# Patient Record
Sex: Female | Born: 1935
Health system: Southern US, Community
[De-identification: ages and names within clinical notes are randomized; demographics above are authoritative.]

## PROBLEM LIST (undated history)

## (undated) DIAGNOSIS — R569 Unspecified convulsions: Secondary | ICD-10-CM

---

## 2014-10-30 DIAGNOSIS — Z96651 Presence of right artificial knee joint: Secondary | ICD-10-CM | POA: Diagnosis not present

## 2014-10-30 DIAGNOSIS — Z7982 Long term (current) use of aspirin: Secondary | ICD-10-CM | POA: Diagnosis not present

## 2014-10-30 DIAGNOSIS — R2689 Other abnormalities of gait and mobility: Secondary | ICD-10-CM | POA: Diagnosis not present

## 2014-10-30 DIAGNOSIS — I10 Essential (primary) hypertension: Secondary | ICD-10-CM | POA: Diagnosis not present

## 2014-10-30 DIAGNOSIS — Z471 Aftercare following joint replacement surgery: Secondary | ICD-10-CM | POA: Diagnosis not present

## 2014-10-30 DIAGNOSIS — G40909 Epilepsy, unspecified, not intractable, without status epilepticus: Secondary | ICD-10-CM | POA: Diagnosis not present

## 2014-11-02 DIAGNOSIS — I10 Essential (primary) hypertension: Secondary | ICD-10-CM | POA: Diagnosis not present

## 2014-11-02 DIAGNOSIS — R2689 Other abnormalities of gait and mobility: Secondary | ICD-10-CM | POA: Diagnosis not present

## 2014-11-02 DIAGNOSIS — Z96651 Presence of right artificial knee joint: Secondary | ICD-10-CM | POA: Diagnosis not present

## 2014-11-02 DIAGNOSIS — Z7982 Long term (current) use of aspirin: Secondary | ICD-10-CM | POA: Diagnosis not present

## 2014-11-02 DIAGNOSIS — Z471 Aftercare following joint replacement surgery: Secondary | ICD-10-CM | POA: Diagnosis not present

## 2014-11-02 DIAGNOSIS — G40909 Epilepsy, unspecified, not intractable, without status epilepticus: Secondary | ICD-10-CM | POA: Diagnosis not present

## 2014-11-03 DIAGNOSIS — Z96651 Presence of right artificial knee joint: Secondary | ICD-10-CM | POA: Diagnosis not present

## 2014-11-03 DIAGNOSIS — R2689 Other abnormalities of gait and mobility: Secondary | ICD-10-CM | POA: Diagnosis not present

## 2014-11-03 DIAGNOSIS — Z7982 Long term (current) use of aspirin: Secondary | ICD-10-CM | POA: Diagnosis not present

## 2014-11-03 DIAGNOSIS — Z471 Aftercare following joint replacement surgery: Secondary | ICD-10-CM | POA: Diagnosis not present

## 2014-11-03 DIAGNOSIS — I10 Essential (primary) hypertension: Secondary | ICD-10-CM | POA: Diagnosis not present

## 2014-11-03 DIAGNOSIS — G40909 Epilepsy, unspecified, not intractable, without status epilepticus: Secondary | ICD-10-CM | POA: Diagnosis not present

## 2014-11-05 DIAGNOSIS — G40909 Epilepsy, unspecified, not intractable, without status epilepticus: Secondary | ICD-10-CM | POA: Diagnosis not present

## 2014-11-05 DIAGNOSIS — Z7982 Long term (current) use of aspirin: Secondary | ICD-10-CM | POA: Diagnosis not present

## 2014-11-05 DIAGNOSIS — R2689 Other abnormalities of gait and mobility: Secondary | ICD-10-CM | POA: Diagnosis not present

## 2014-11-05 DIAGNOSIS — Z96651 Presence of right artificial knee joint: Secondary | ICD-10-CM | POA: Diagnosis not present

## 2014-11-05 DIAGNOSIS — Z471 Aftercare following joint replacement surgery: Secondary | ICD-10-CM | POA: Diagnosis not present

## 2014-11-05 DIAGNOSIS — I10 Essential (primary) hypertension: Secondary | ICD-10-CM | POA: Diagnosis not present

## 2014-11-07 DIAGNOSIS — Z471 Aftercare following joint replacement surgery: Secondary | ICD-10-CM | POA: Diagnosis not present

## 2014-11-07 DIAGNOSIS — R2689 Other abnormalities of gait and mobility: Secondary | ICD-10-CM | POA: Diagnosis not present

## 2014-11-07 DIAGNOSIS — I10 Essential (primary) hypertension: Secondary | ICD-10-CM | POA: Diagnosis not present

## 2014-11-07 DIAGNOSIS — Z7982 Long term (current) use of aspirin: Secondary | ICD-10-CM | POA: Diagnosis not present

## 2014-11-07 DIAGNOSIS — G40909 Epilepsy, unspecified, not intractable, without status epilepticus: Secondary | ICD-10-CM | POA: Diagnosis not present

## 2014-11-07 DIAGNOSIS — Z96651 Presence of right artificial knee joint: Secondary | ICD-10-CM | POA: Diagnosis not present

## 2014-11-08 DIAGNOSIS — Z7982 Long term (current) use of aspirin: Secondary | ICD-10-CM | POA: Diagnosis not present

## 2014-11-08 DIAGNOSIS — Z471 Aftercare following joint replacement surgery: Secondary | ICD-10-CM | POA: Diagnosis not present

## 2014-11-08 DIAGNOSIS — Z96651 Presence of right artificial knee joint: Secondary | ICD-10-CM | POA: Diagnosis not present

## 2014-11-08 DIAGNOSIS — R2689 Other abnormalities of gait and mobility: Secondary | ICD-10-CM | POA: Diagnosis not present

## 2014-11-08 DIAGNOSIS — G40909 Epilepsy, unspecified, not intractable, without status epilepticus: Secondary | ICD-10-CM | POA: Diagnosis not present

## 2014-11-08 DIAGNOSIS — I10 Essential (primary) hypertension: Secondary | ICD-10-CM | POA: Diagnosis not present

## 2014-11-09 DIAGNOSIS — Z96651 Presence of right artificial knee joint: Secondary | ICD-10-CM | POA: Diagnosis not present

## 2014-11-09 DIAGNOSIS — Z471 Aftercare following joint replacement surgery: Secondary | ICD-10-CM | POA: Diagnosis not present

## 2014-11-09 DIAGNOSIS — G40909 Epilepsy, unspecified, not intractable, without status epilepticus: Secondary | ICD-10-CM | POA: Diagnosis not present

## 2014-11-09 DIAGNOSIS — Z7982 Long term (current) use of aspirin: Secondary | ICD-10-CM | POA: Diagnosis not present

## 2014-11-09 DIAGNOSIS — I10 Essential (primary) hypertension: Secondary | ICD-10-CM | POA: Diagnosis not present

## 2014-11-09 DIAGNOSIS — R2689 Other abnormalities of gait and mobility: Secondary | ICD-10-CM | POA: Diagnosis not present

## 2014-11-13 DIAGNOSIS — Z96651 Presence of right artificial knee joint: Secondary | ICD-10-CM | POA: Diagnosis not present

## 2014-11-13 DIAGNOSIS — Z7982 Long term (current) use of aspirin: Secondary | ICD-10-CM | POA: Diagnosis not present

## 2014-11-13 DIAGNOSIS — G40909 Epilepsy, unspecified, not intractable, without status epilepticus: Secondary | ICD-10-CM | POA: Diagnosis not present

## 2014-11-13 DIAGNOSIS — Z471 Aftercare following joint replacement surgery: Secondary | ICD-10-CM | POA: Diagnosis not present

## 2014-11-13 DIAGNOSIS — R2689 Other abnormalities of gait and mobility: Secondary | ICD-10-CM | POA: Diagnosis not present

## 2014-11-13 DIAGNOSIS — I10 Essential (primary) hypertension: Secondary | ICD-10-CM | POA: Diagnosis not present

## 2014-11-16 DIAGNOSIS — Z96651 Presence of right artificial knee joint: Secondary | ICD-10-CM | POA: Diagnosis not present

## 2014-11-16 DIAGNOSIS — Z471 Aftercare following joint replacement surgery: Secondary | ICD-10-CM | POA: Diagnosis not present

## 2014-11-16 DIAGNOSIS — I10 Essential (primary) hypertension: Secondary | ICD-10-CM | POA: Diagnosis not present

## 2014-11-16 DIAGNOSIS — G40909 Epilepsy, unspecified, not intractable, without status epilepticus: Secondary | ICD-10-CM | POA: Diagnosis not present

## 2014-11-16 DIAGNOSIS — Z7982 Long term (current) use of aspirin: Secondary | ICD-10-CM | POA: Diagnosis not present

## 2014-11-16 DIAGNOSIS — R2689 Other abnormalities of gait and mobility: Secondary | ICD-10-CM | POA: Diagnosis not present

## 2014-11-21 DIAGNOSIS — Z7982 Long term (current) use of aspirin: Secondary | ICD-10-CM | POA: Diagnosis not present

## 2014-11-21 DIAGNOSIS — Z471 Aftercare following joint replacement surgery: Secondary | ICD-10-CM | POA: Diagnosis not present

## 2014-11-21 DIAGNOSIS — G40909 Epilepsy, unspecified, not intractable, without status epilepticus: Secondary | ICD-10-CM | POA: Diagnosis not present

## 2014-11-21 DIAGNOSIS — R2689 Other abnormalities of gait and mobility: Secondary | ICD-10-CM | POA: Diagnosis not present

## 2014-11-21 DIAGNOSIS — I10 Essential (primary) hypertension: Secondary | ICD-10-CM | POA: Diagnosis not present

## 2014-11-21 DIAGNOSIS — Z96651 Presence of right artificial knee joint: Secondary | ICD-10-CM | POA: Diagnosis not present

## 2014-11-23 DIAGNOSIS — Z7982 Long term (current) use of aspirin: Secondary | ICD-10-CM | POA: Diagnosis not present

## 2014-11-23 DIAGNOSIS — G40909 Epilepsy, unspecified, not intractable, without status epilepticus: Secondary | ICD-10-CM | POA: Diagnosis not present

## 2014-11-23 DIAGNOSIS — R2689 Other abnormalities of gait and mobility: Secondary | ICD-10-CM | POA: Diagnosis not present

## 2014-11-23 DIAGNOSIS — Z96651 Presence of right artificial knee joint: Secondary | ICD-10-CM | POA: Diagnosis not present

## 2014-11-23 DIAGNOSIS — I10 Essential (primary) hypertension: Secondary | ICD-10-CM | POA: Diagnosis not present

## 2014-11-23 DIAGNOSIS — Z471 Aftercare following joint replacement surgery: Secondary | ICD-10-CM | POA: Diagnosis not present

## 2014-11-26 DIAGNOSIS — Z7982 Long term (current) use of aspirin: Secondary | ICD-10-CM | POA: Diagnosis not present

## 2014-11-26 DIAGNOSIS — G40909 Epilepsy, unspecified, not intractable, without status epilepticus: Secondary | ICD-10-CM | POA: Diagnosis not present

## 2014-11-26 DIAGNOSIS — Z96651 Presence of right artificial knee joint: Secondary | ICD-10-CM | POA: Diagnosis not present

## 2014-11-26 DIAGNOSIS — R2689 Other abnormalities of gait and mobility: Secondary | ICD-10-CM | POA: Diagnosis not present

## 2014-11-26 DIAGNOSIS — I10 Essential (primary) hypertension: Secondary | ICD-10-CM | POA: Diagnosis not present

## 2014-11-26 DIAGNOSIS — Z471 Aftercare following joint replacement surgery: Secondary | ICD-10-CM | POA: Diagnosis not present

## 2014-11-29 DIAGNOSIS — Z7982 Long term (current) use of aspirin: Secondary | ICD-10-CM | POA: Diagnosis not present

## 2014-11-29 DIAGNOSIS — G40909 Epilepsy, unspecified, not intractable, without status epilepticus: Secondary | ICD-10-CM | POA: Diagnosis not present

## 2014-11-29 DIAGNOSIS — Z96651 Presence of right artificial knee joint: Secondary | ICD-10-CM | POA: Diagnosis not present

## 2014-11-29 DIAGNOSIS — R2689 Other abnormalities of gait and mobility: Secondary | ICD-10-CM | POA: Diagnosis not present

## 2014-11-29 DIAGNOSIS — I10 Essential (primary) hypertension: Secondary | ICD-10-CM | POA: Diagnosis not present

## 2014-11-29 DIAGNOSIS — Z471 Aftercare following joint replacement surgery: Secondary | ICD-10-CM | POA: Diagnosis not present

## 2014-12-04 DIAGNOSIS — G40909 Epilepsy, unspecified, not intractable, without status epilepticus: Secondary | ICD-10-CM | POA: Diagnosis not present

## 2014-12-04 DIAGNOSIS — R2689 Other abnormalities of gait and mobility: Secondary | ICD-10-CM | POA: Diagnosis not present

## 2014-12-04 DIAGNOSIS — Z96651 Presence of right artificial knee joint: Secondary | ICD-10-CM | POA: Diagnosis not present

## 2014-12-04 DIAGNOSIS — Z7982 Long term (current) use of aspirin: Secondary | ICD-10-CM | POA: Diagnosis not present

## 2014-12-04 DIAGNOSIS — Z471 Aftercare following joint replacement surgery: Secondary | ICD-10-CM | POA: Diagnosis not present

## 2014-12-04 DIAGNOSIS — I10 Essential (primary) hypertension: Secondary | ICD-10-CM | POA: Diagnosis not present

## 2014-12-06 DIAGNOSIS — G40909 Epilepsy, unspecified, not intractable, without status epilepticus: Secondary | ICD-10-CM | POA: Diagnosis not present

## 2014-12-06 DIAGNOSIS — Z471 Aftercare following joint replacement surgery: Secondary | ICD-10-CM | POA: Diagnosis not present

## 2014-12-06 DIAGNOSIS — Z7982 Long term (current) use of aspirin: Secondary | ICD-10-CM | POA: Diagnosis not present

## 2014-12-06 DIAGNOSIS — R2689 Other abnormalities of gait and mobility: Secondary | ICD-10-CM | POA: Diagnosis not present

## 2014-12-06 DIAGNOSIS — Z96651 Presence of right artificial knee joint: Secondary | ICD-10-CM | POA: Diagnosis not present

## 2014-12-06 DIAGNOSIS — I10 Essential (primary) hypertension: Secondary | ICD-10-CM | POA: Diagnosis not present

## 2014-12-12 DIAGNOSIS — Z96651 Presence of right artificial knee joint: Secondary | ICD-10-CM | POA: Diagnosis not present

## 2014-12-12 DIAGNOSIS — G40909 Epilepsy, unspecified, not intractable, without status epilepticus: Secondary | ICD-10-CM | POA: Diagnosis not present

## 2014-12-12 DIAGNOSIS — Z7982 Long term (current) use of aspirin: Secondary | ICD-10-CM | POA: Diagnosis not present

## 2014-12-12 DIAGNOSIS — R2689 Other abnormalities of gait and mobility: Secondary | ICD-10-CM | POA: Diagnosis not present

## 2014-12-12 DIAGNOSIS — Z471 Aftercare following joint replacement surgery: Secondary | ICD-10-CM | POA: Diagnosis not present

## 2014-12-12 DIAGNOSIS — I10 Essential (primary) hypertension: Secondary | ICD-10-CM | POA: Diagnosis not present

## 2014-12-13 DIAGNOSIS — G40909 Epilepsy, unspecified, not intractable, without status epilepticus: Secondary | ICD-10-CM | POA: Diagnosis not present

## 2014-12-13 DIAGNOSIS — I10 Essential (primary) hypertension: Secondary | ICD-10-CM | POA: Diagnosis not present

## 2014-12-13 DIAGNOSIS — R5383 Other fatigue: Secondary | ICD-10-CM | POA: Diagnosis not present

## 2014-12-13 DIAGNOSIS — M179 Osteoarthritis of knee, unspecified: Secondary | ICD-10-CM | POA: Diagnosis not present

## 2014-12-14 DIAGNOSIS — F411 Generalized anxiety disorder: Secondary | ICD-10-CM | POA: Diagnosis not present

## 2014-12-14 DIAGNOSIS — Z7982 Long term (current) use of aspirin: Secondary | ICD-10-CM | POA: Diagnosis not present

## 2014-12-14 DIAGNOSIS — G40909 Epilepsy, unspecified, not intractable, without status epilepticus: Secondary | ICD-10-CM | POA: Diagnosis not present

## 2014-12-14 DIAGNOSIS — Z96651 Presence of right artificial knee joint: Secondary | ICD-10-CM | POA: Diagnosis not present

## 2014-12-14 DIAGNOSIS — Z471 Aftercare following joint replacement surgery: Secondary | ICD-10-CM | POA: Diagnosis not present

## 2014-12-14 DIAGNOSIS — I10 Essential (primary) hypertension: Secondary | ICD-10-CM | POA: Diagnosis not present

## 2014-12-14 DIAGNOSIS — R2689 Other abnormalities of gait and mobility: Secondary | ICD-10-CM | POA: Diagnosis not present

## 2014-12-14 DIAGNOSIS — F330 Major depressive disorder, recurrent, mild: Secondary | ICD-10-CM | POA: Diagnosis not present

## 2014-12-15 DIAGNOSIS — N390 Urinary tract infection, site not specified: Secondary | ICD-10-CM | POA: Diagnosis not present

## 2014-12-18 DIAGNOSIS — G40109 Localization-related (focal) (partial) symptomatic epilepsy and epileptic syndromes with simple partial seizures, not intractable, without status epilepticus: Secondary | ICD-10-CM | POA: Diagnosis not present

## 2014-12-18 DIAGNOSIS — F411 Generalized anxiety disorder: Secondary | ICD-10-CM | POA: Diagnosis not present

## 2014-12-18 DIAGNOSIS — F330 Major depressive disorder, recurrent, mild: Secondary | ICD-10-CM | POA: Diagnosis not present

## 2014-12-19 DIAGNOSIS — Z471 Aftercare following joint replacement surgery: Secondary | ICD-10-CM | POA: Diagnosis not present

## 2014-12-19 DIAGNOSIS — Z96651 Presence of right artificial knee joint: Secondary | ICD-10-CM | POA: Diagnosis not present

## 2014-12-19 DIAGNOSIS — Z7982 Long term (current) use of aspirin: Secondary | ICD-10-CM | POA: Diagnosis not present

## 2014-12-19 DIAGNOSIS — I10 Essential (primary) hypertension: Secondary | ICD-10-CM | POA: Diagnosis not present

## 2014-12-19 DIAGNOSIS — R2689 Other abnormalities of gait and mobility: Secondary | ICD-10-CM | POA: Diagnosis not present

## 2014-12-19 DIAGNOSIS — G40909 Epilepsy, unspecified, not intractable, without status epilepticus: Secondary | ICD-10-CM | POA: Diagnosis not present

## 2014-12-20 DIAGNOSIS — Z96651 Presence of right artificial knee joint: Secondary | ICD-10-CM | POA: Diagnosis not present

## 2014-12-20 DIAGNOSIS — M25561 Pain in right knee: Secondary | ICD-10-CM | POA: Diagnosis not present

## 2014-12-20 DIAGNOSIS — M150 Primary generalized (osteo)arthritis: Secondary | ICD-10-CM | POA: Diagnosis not present

## 2014-12-20 DIAGNOSIS — R2689 Other abnormalities of gait and mobility: Secondary | ICD-10-CM | POA: Diagnosis not present

## 2014-12-21 DIAGNOSIS — R2689 Other abnormalities of gait and mobility: Secondary | ICD-10-CM | POA: Diagnosis not present

## 2014-12-21 DIAGNOSIS — Z471 Aftercare following joint replacement surgery: Secondary | ICD-10-CM | POA: Diagnosis not present

## 2014-12-21 DIAGNOSIS — I10 Essential (primary) hypertension: Secondary | ICD-10-CM | POA: Diagnosis not present

## 2014-12-21 DIAGNOSIS — G40909 Epilepsy, unspecified, not intractable, without status epilepticus: Secondary | ICD-10-CM | POA: Diagnosis not present

## 2014-12-21 DIAGNOSIS — Z96651 Presence of right artificial knee joint: Secondary | ICD-10-CM | POA: Diagnosis not present

## 2014-12-21 DIAGNOSIS — Z7982 Long term (current) use of aspirin: Secondary | ICD-10-CM | POA: Diagnosis not present

## 2014-12-25 DIAGNOSIS — R2689 Other abnormalities of gait and mobility: Secondary | ICD-10-CM | POA: Diagnosis not present

## 2014-12-25 DIAGNOSIS — Z96651 Presence of right artificial knee joint: Secondary | ICD-10-CM | POA: Diagnosis not present

## 2014-12-25 DIAGNOSIS — Z471 Aftercare following joint replacement surgery: Secondary | ICD-10-CM | POA: Diagnosis not present

## 2014-12-25 DIAGNOSIS — I10 Essential (primary) hypertension: Secondary | ICD-10-CM | POA: Diagnosis not present

## 2014-12-25 DIAGNOSIS — G40909 Epilepsy, unspecified, not intractable, without status epilepticus: Secondary | ICD-10-CM | POA: Diagnosis not present

## 2014-12-25 DIAGNOSIS — Z7982 Long term (current) use of aspirin: Secondary | ICD-10-CM | POA: Diagnosis not present

## 2014-12-26 DIAGNOSIS — Z96651 Presence of right artificial knee joint: Secondary | ICD-10-CM | POA: Diagnosis not present

## 2014-12-26 DIAGNOSIS — E874 Mixed disorder of acid-base balance: Secondary | ICD-10-CM | POA: Diagnosis not present

## 2014-12-26 DIAGNOSIS — G40909 Epilepsy, unspecified, not intractable, without status epilepticus: Secondary | ICD-10-CM | POA: Diagnosis not present

## 2014-12-26 DIAGNOSIS — Z471 Aftercare following joint replacement surgery: Secondary | ICD-10-CM | POA: Diagnosis not present

## 2014-12-26 DIAGNOSIS — D649 Anemia, unspecified: Secondary | ICD-10-CM | POA: Diagnosis not present

## 2014-12-26 DIAGNOSIS — Z7982 Long term (current) use of aspirin: Secondary | ICD-10-CM | POA: Diagnosis not present

## 2014-12-26 DIAGNOSIS — R2689 Other abnormalities of gait and mobility: Secondary | ICD-10-CM | POA: Diagnosis not present

## 2014-12-26 DIAGNOSIS — I10 Essential (primary) hypertension: Secondary | ICD-10-CM | POA: Diagnosis not present

## 2014-12-27 DIAGNOSIS — Z471 Aftercare following joint replacement surgery: Secondary | ICD-10-CM | POA: Diagnosis not present

## 2014-12-27 DIAGNOSIS — Z96651 Presence of right artificial knee joint: Secondary | ICD-10-CM | POA: Diagnosis not present

## 2014-12-27 DIAGNOSIS — I10 Essential (primary) hypertension: Secondary | ICD-10-CM | POA: Diagnosis not present

## 2014-12-27 DIAGNOSIS — G40909 Epilepsy, unspecified, not intractable, without status epilepticus: Secondary | ICD-10-CM | POA: Diagnosis not present

## 2014-12-27 DIAGNOSIS — Z7982 Long term (current) use of aspirin: Secondary | ICD-10-CM | POA: Diagnosis not present

## 2014-12-27 DIAGNOSIS — R2689 Other abnormalities of gait and mobility: Secondary | ICD-10-CM | POA: Diagnosis not present

## 2014-12-29 DIAGNOSIS — M25361 Other instability, right knee: Secondary | ICD-10-CM | POA: Diagnosis not present

## 2014-12-29 DIAGNOSIS — Z96651 Presence of right artificial knee joint: Secondary | ICD-10-CM | POA: Diagnosis not present

## 2014-12-29 DIAGNOSIS — I10 Essential (primary) hypertension: Secondary | ICD-10-CM | POA: Diagnosis not present

## 2014-12-29 DIAGNOSIS — R260 Ataxic gait: Secondary | ICD-10-CM | POA: Diagnosis not present

## 2014-12-29 DIAGNOSIS — Z471 Aftercare following joint replacement surgery: Secondary | ICD-10-CM | POA: Diagnosis not present

## 2014-12-29 DIAGNOSIS — Z9181 History of falling: Secondary | ICD-10-CM | POA: Diagnosis not present

## 2014-12-29 DIAGNOSIS — M25561 Pain in right knee: Secondary | ICD-10-CM | POA: Diagnosis not present

## 2014-12-29 DIAGNOSIS — D510 Vitamin B12 deficiency anemia due to intrinsic factor deficiency: Secondary | ICD-10-CM | POA: Diagnosis not present

## 2014-12-29 DIAGNOSIS — Z96652 Presence of left artificial knee joint: Secondary | ICD-10-CM | POA: Diagnosis not present

## 2014-12-29 DIAGNOSIS — M6281 Muscle weakness (generalized): Secondary | ICD-10-CM | POA: Diagnosis not present

## 2014-12-29 DIAGNOSIS — G40909 Epilepsy, unspecified, not intractable, without status epilepticus: Secondary | ICD-10-CM | POA: Diagnosis not present

## 2014-12-29 DIAGNOSIS — Z79899 Other long term (current) drug therapy: Secondary | ICD-10-CM | POA: Diagnosis not present

## 2014-12-31 DIAGNOSIS — I10 Essential (primary) hypertension: Secondary | ICD-10-CM | POA: Diagnosis not present

## 2014-12-31 DIAGNOSIS — G40909 Epilepsy, unspecified, not intractable, without status epilepticus: Secondary | ICD-10-CM | POA: Diagnosis not present

## 2014-12-31 DIAGNOSIS — M25361 Other instability, right knee: Secondary | ICD-10-CM | POA: Diagnosis not present

## 2014-12-31 DIAGNOSIS — Z471 Aftercare following joint replacement surgery: Secondary | ICD-10-CM | POA: Diagnosis not present

## 2014-12-31 DIAGNOSIS — D510 Vitamin B12 deficiency anemia due to intrinsic factor deficiency: Secondary | ICD-10-CM | POA: Diagnosis not present

## 2014-12-31 DIAGNOSIS — Z96651 Presence of right artificial knee joint: Secondary | ICD-10-CM | POA: Diagnosis not present

## 2015-01-02 DIAGNOSIS — G40909 Epilepsy, unspecified, not intractable, without status epilepticus: Secondary | ICD-10-CM | POA: Diagnosis not present

## 2015-01-02 DIAGNOSIS — D510 Vitamin B12 deficiency anemia due to intrinsic factor deficiency: Secondary | ICD-10-CM | POA: Diagnosis not present

## 2015-01-02 DIAGNOSIS — Z471 Aftercare following joint replacement surgery: Secondary | ICD-10-CM | POA: Diagnosis not present

## 2015-01-02 DIAGNOSIS — I10 Essential (primary) hypertension: Secondary | ICD-10-CM | POA: Diagnosis not present

## 2015-01-02 DIAGNOSIS — Z96651 Presence of right artificial knee joint: Secondary | ICD-10-CM | POA: Diagnosis not present

## 2015-01-02 DIAGNOSIS — M25361 Other instability, right knee: Secondary | ICD-10-CM | POA: Diagnosis not present

## 2015-01-04 DIAGNOSIS — Z471 Aftercare following joint replacement surgery: Secondary | ICD-10-CM | POA: Diagnosis not present

## 2015-01-04 DIAGNOSIS — G40909 Epilepsy, unspecified, not intractable, without status epilepticus: Secondary | ICD-10-CM | POA: Diagnosis not present

## 2015-01-04 DIAGNOSIS — D510 Vitamin B12 deficiency anemia due to intrinsic factor deficiency: Secondary | ICD-10-CM | POA: Diagnosis not present

## 2015-01-04 DIAGNOSIS — I10 Essential (primary) hypertension: Secondary | ICD-10-CM | POA: Diagnosis not present

## 2015-01-04 DIAGNOSIS — M25361 Other instability, right knee: Secondary | ICD-10-CM | POA: Diagnosis not present

## 2015-01-04 DIAGNOSIS — Z96651 Presence of right artificial knee joint: Secondary | ICD-10-CM | POA: Diagnosis not present

## 2015-01-07 DIAGNOSIS — Z96651 Presence of right artificial knee joint: Secondary | ICD-10-CM | POA: Diagnosis not present

## 2015-01-07 DIAGNOSIS — I10 Essential (primary) hypertension: Secondary | ICD-10-CM | POA: Diagnosis not present

## 2015-01-07 DIAGNOSIS — G40909 Epilepsy, unspecified, not intractable, without status epilepticus: Secondary | ICD-10-CM | POA: Diagnosis not present

## 2015-01-07 DIAGNOSIS — D510 Vitamin B12 deficiency anemia due to intrinsic factor deficiency: Secondary | ICD-10-CM | POA: Diagnosis not present

## 2015-01-07 DIAGNOSIS — Z471 Aftercare following joint replacement surgery: Secondary | ICD-10-CM | POA: Diagnosis not present

## 2015-01-07 DIAGNOSIS — M25361 Other instability, right knee: Secondary | ICD-10-CM | POA: Diagnosis not present

## 2015-01-09 DIAGNOSIS — G40909 Epilepsy, unspecified, not intractable, without status epilepticus: Secondary | ICD-10-CM | POA: Diagnosis not present

## 2015-01-09 DIAGNOSIS — Z471 Aftercare following joint replacement surgery: Secondary | ICD-10-CM | POA: Diagnosis not present

## 2015-01-09 DIAGNOSIS — D510 Vitamin B12 deficiency anemia due to intrinsic factor deficiency: Secondary | ICD-10-CM | POA: Diagnosis not present

## 2015-01-09 DIAGNOSIS — I10 Essential (primary) hypertension: Secondary | ICD-10-CM | POA: Diagnosis not present

## 2015-01-09 DIAGNOSIS — Z96651 Presence of right artificial knee joint: Secondary | ICD-10-CM | POA: Diagnosis not present

## 2015-01-09 DIAGNOSIS — M25361 Other instability, right knee: Secondary | ICD-10-CM | POA: Diagnosis not present

## 2015-01-11 DIAGNOSIS — G40909 Epilepsy, unspecified, not intractable, without status epilepticus: Secondary | ICD-10-CM | POA: Diagnosis not present

## 2015-01-11 DIAGNOSIS — Z96651 Presence of right artificial knee joint: Secondary | ICD-10-CM | POA: Diagnosis not present

## 2015-01-11 DIAGNOSIS — D510 Vitamin B12 deficiency anemia due to intrinsic factor deficiency: Secondary | ICD-10-CM | POA: Diagnosis not present

## 2015-01-11 DIAGNOSIS — I10 Essential (primary) hypertension: Secondary | ICD-10-CM | POA: Diagnosis not present

## 2015-01-11 DIAGNOSIS — M25361 Other instability, right knee: Secondary | ICD-10-CM | POA: Diagnosis not present

## 2015-01-11 DIAGNOSIS — Z471 Aftercare following joint replacement surgery: Secondary | ICD-10-CM | POA: Diagnosis not present

## 2015-01-14 DIAGNOSIS — G40909 Epilepsy, unspecified, not intractable, without status epilepticus: Secondary | ICD-10-CM | POA: Diagnosis not present

## 2015-01-14 DIAGNOSIS — D510 Vitamin B12 deficiency anemia due to intrinsic factor deficiency: Secondary | ICD-10-CM | POA: Diagnosis not present

## 2015-01-14 DIAGNOSIS — Z471 Aftercare following joint replacement surgery: Secondary | ICD-10-CM | POA: Diagnosis not present

## 2015-01-14 DIAGNOSIS — M25361 Other instability, right knee: Secondary | ICD-10-CM | POA: Diagnosis not present

## 2015-01-14 DIAGNOSIS — Z96651 Presence of right artificial knee joint: Secondary | ICD-10-CM | POA: Diagnosis not present

## 2015-01-14 DIAGNOSIS — I10 Essential (primary) hypertension: Secondary | ICD-10-CM | POA: Diagnosis not present

## 2015-01-15 DIAGNOSIS — F411 Generalized anxiety disorder: Secondary | ICD-10-CM | POA: Diagnosis not present

## 2015-01-15 DIAGNOSIS — G40109 Localization-related (focal) (partial) symptomatic epilepsy and epileptic syndromes with simple partial seizures, not intractable, without status epilepticus: Secondary | ICD-10-CM | POA: Diagnosis not present

## 2015-01-15 DIAGNOSIS — F330 Major depressive disorder, recurrent, mild: Secondary | ICD-10-CM | POA: Diagnosis not present

## 2015-01-16 DIAGNOSIS — D510 Vitamin B12 deficiency anemia due to intrinsic factor deficiency: Secondary | ICD-10-CM | POA: Diagnosis not present

## 2015-01-16 DIAGNOSIS — Z96651 Presence of right artificial knee joint: Secondary | ICD-10-CM | POA: Diagnosis not present

## 2015-01-16 DIAGNOSIS — I10 Essential (primary) hypertension: Secondary | ICD-10-CM | POA: Diagnosis not present

## 2015-01-16 DIAGNOSIS — Z79899 Other long term (current) drug therapy: Secondary | ICD-10-CM | POA: Diagnosis not present

## 2015-01-16 DIAGNOSIS — D649 Anemia, unspecified: Secondary | ICD-10-CM | POA: Diagnosis not present

## 2015-01-16 DIAGNOSIS — M25361 Other instability, right knee: Secondary | ICD-10-CM | POA: Diagnosis not present

## 2015-01-16 DIAGNOSIS — Z471 Aftercare following joint replacement surgery: Secondary | ICD-10-CM | POA: Diagnosis not present

## 2015-01-16 DIAGNOSIS — G40909 Epilepsy, unspecified, not intractable, without status epilepticus: Secondary | ICD-10-CM | POA: Diagnosis not present

## 2015-01-17 DIAGNOSIS — M25361 Other instability, right knee: Secondary | ICD-10-CM | POA: Diagnosis not present

## 2015-01-17 DIAGNOSIS — R5383 Other fatigue: Secondary | ICD-10-CM | POA: Diagnosis not present

## 2015-01-17 DIAGNOSIS — I10 Essential (primary) hypertension: Secondary | ICD-10-CM | POA: Diagnosis not present

## 2015-01-17 DIAGNOSIS — Z471 Aftercare following joint replacement surgery: Secondary | ICD-10-CM | POA: Diagnosis not present

## 2015-01-17 DIAGNOSIS — Z96651 Presence of right artificial knee joint: Secondary | ICD-10-CM | POA: Diagnosis not present

## 2015-01-17 DIAGNOSIS — M179 Osteoarthritis of knee, unspecified: Secondary | ICD-10-CM | POA: Diagnosis not present

## 2015-01-17 DIAGNOSIS — G40909 Epilepsy, unspecified, not intractable, without status epilepticus: Secondary | ICD-10-CM | POA: Diagnosis not present

## 2015-01-17 DIAGNOSIS — D510 Vitamin B12 deficiency anemia due to intrinsic factor deficiency: Secondary | ICD-10-CM | POA: Diagnosis not present

## 2015-01-18 DIAGNOSIS — Z471 Aftercare following joint replacement surgery: Secondary | ICD-10-CM | POA: Diagnosis not present

## 2015-01-18 DIAGNOSIS — I10 Essential (primary) hypertension: Secondary | ICD-10-CM | POA: Diagnosis not present

## 2015-01-18 DIAGNOSIS — D510 Vitamin B12 deficiency anemia due to intrinsic factor deficiency: Secondary | ICD-10-CM | POA: Diagnosis not present

## 2015-01-18 DIAGNOSIS — M25361 Other instability, right knee: Secondary | ICD-10-CM | POA: Diagnosis not present

## 2015-01-18 DIAGNOSIS — G40909 Epilepsy, unspecified, not intractable, without status epilepticus: Secondary | ICD-10-CM | POA: Diagnosis not present

## 2015-01-18 DIAGNOSIS — Z96651 Presence of right artificial knee joint: Secondary | ICD-10-CM | POA: Diagnosis not present

## 2015-01-23 DIAGNOSIS — D510 Vitamin B12 deficiency anemia due to intrinsic factor deficiency: Secondary | ICD-10-CM | POA: Diagnosis not present

## 2015-01-23 DIAGNOSIS — Z471 Aftercare following joint replacement surgery: Secondary | ICD-10-CM | POA: Diagnosis not present

## 2015-01-23 DIAGNOSIS — M25361 Other instability, right knee: Secondary | ICD-10-CM | POA: Diagnosis not present

## 2015-01-23 DIAGNOSIS — I10 Essential (primary) hypertension: Secondary | ICD-10-CM | POA: Diagnosis not present

## 2015-01-23 DIAGNOSIS — S76119A Strain of unspecified quadriceps muscle, fascia and tendon, initial encounter: Secondary | ICD-10-CM | POA: Diagnosis not present

## 2015-01-23 DIAGNOSIS — Z96651 Presence of right artificial knee joint: Secondary | ICD-10-CM | POA: Diagnosis not present

## 2015-01-23 DIAGNOSIS — G40909 Epilepsy, unspecified, not intractable, without status epilepticus: Secondary | ICD-10-CM | POA: Diagnosis not present

## 2015-01-25 DIAGNOSIS — D510 Vitamin B12 deficiency anemia due to intrinsic factor deficiency: Secondary | ICD-10-CM | POA: Diagnosis not present

## 2015-01-25 DIAGNOSIS — Z471 Aftercare following joint replacement surgery: Secondary | ICD-10-CM | POA: Diagnosis not present

## 2015-01-25 DIAGNOSIS — I10 Essential (primary) hypertension: Secondary | ICD-10-CM | POA: Diagnosis not present

## 2015-01-25 DIAGNOSIS — Z96651 Presence of right artificial knee joint: Secondary | ICD-10-CM | POA: Diagnosis not present

## 2015-01-25 DIAGNOSIS — M25361 Other instability, right knee: Secondary | ICD-10-CM | POA: Diagnosis not present

## 2015-01-25 DIAGNOSIS — G40909 Epilepsy, unspecified, not intractable, without status epilepticus: Secondary | ICD-10-CM | POA: Diagnosis not present

## 2015-01-29 DIAGNOSIS — G40909 Epilepsy, unspecified, not intractable, without status epilepticus: Secondary | ICD-10-CM | POA: Diagnosis not present

## 2015-01-29 DIAGNOSIS — M25361 Other instability, right knee: Secondary | ICD-10-CM | POA: Diagnosis not present

## 2015-01-29 DIAGNOSIS — D510 Vitamin B12 deficiency anemia due to intrinsic factor deficiency: Secondary | ICD-10-CM | POA: Diagnosis not present

## 2015-01-29 DIAGNOSIS — Z96651 Presence of right artificial knee joint: Secondary | ICD-10-CM | POA: Diagnosis not present

## 2015-01-29 DIAGNOSIS — I10 Essential (primary) hypertension: Secondary | ICD-10-CM | POA: Diagnosis not present

## 2015-01-29 DIAGNOSIS — Z471 Aftercare following joint replacement surgery: Secondary | ICD-10-CM | POA: Diagnosis not present

## 2015-01-31 DIAGNOSIS — Z471 Aftercare following joint replacement surgery: Secondary | ICD-10-CM | POA: Diagnosis not present

## 2015-01-31 DIAGNOSIS — M25361 Other instability, right knee: Secondary | ICD-10-CM | POA: Diagnosis not present

## 2015-01-31 DIAGNOSIS — Z96651 Presence of right artificial knee joint: Secondary | ICD-10-CM | POA: Diagnosis not present

## 2015-01-31 DIAGNOSIS — D510 Vitamin B12 deficiency anemia due to intrinsic factor deficiency: Secondary | ICD-10-CM | POA: Diagnosis not present

## 2015-01-31 DIAGNOSIS — I10 Essential (primary) hypertension: Secondary | ICD-10-CM | POA: Diagnosis not present

## 2015-01-31 DIAGNOSIS — G40909 Epilepsy, unspecified, not intractable, without status epilepticus: Secondary | ICD-10-CM | POA: Diagnosis not present

## 2015-02-05 DIAGNOSIS — I10 Essential (primary) hypertension: Secondary | ICD-10-CM | POA: Diagnosis not present

## 2015-02-05 DIAGNOSIS — G40909 Epilepsy, unspecified, not intractable, without status epilepticus: Secondary | ICD-10-CM | POA: Diagnosis not present

## 2015-02-05 DIAGNOSIS — D510 Vitamin B12 deficiency anemia due to intrinsic factor deficiency: Secondary | ICD-10-CM | POA: Diagnosis not present

## 2015-02-05 DIAGNOSIS — Z96651 Presence of right artificial knee joint: Secondary | ICD-10-CM | POA: Diagnosis not present

## 2015-02-05 DIAGNOSIS — M25361 Other instability, right knee: Secondary | ICD-10-CM | POA: Diagnosis not present

## 2015-02-05 DIAGNOSIS — Z471 Aftercare following joint replacement surgery: Secondary | ICD-10-CM | POA: Diagnosis not present

## 2015-02-06 DIAGNOSIS — F330 Major depressive disorder, recurrent, mild: Secondary | ICD-10-CM | POA: Diagnosis not present

## 2015-02-06 DIAGNOSIS — Z96651 Presence of right artificial knee joint: Secondary | ICD-10-CM | POA: Diagnosis not present

## 2015-02-06 DIAGNOSIS — M7650 Patellar tendinitis, unspecified knee: Secondary | ICD-10-CM | POA: Diagnosis not present

## 2015-02-06 DIAGNOSIS — F411 Generalized anxiety disorder: Secondary | ICD-10-CM | POA: Diagnosis not present

## 2015-02-06 DIAGNOSIS — M25469 Effusion, unspecified knee: Secondary | ICD-10-CM | POA: Diagnosis not present

## 2015-02-06 DIAGNOSIS — M7651 Patellar tendinitis, right knee: Secondary | ICD-10-CM | POA: Diagnosis not present

## 2015-02-06 DIAGNOSIS — M25461 Effusion, right knee: Secondary | ICD-10-CM | POA: Diagnosis not present

## 2015-02-06 DIAGNOSIS — M66861 Spontaneous rupture of other tendons, right lower leg: Secondary | ICD-10-CM | POA: Diagnosis not present

## 2015-02-06 DIAGNOSIS — G40109 Localization-related (focal) (partial) symptomatic epilepsy and epileptic syndromes with simple partial seizures, not intractable, without status epilepticus: Secondary | ICD-10-CM | POA: Diagnosis not present

## 2015-02-07 DIAGNOSIS — M25361 Other instability, right knee: Secondary | ICD-10-CM | POA: Diagnosis not present

## 2015-02-07 DIAGNOSIS — M179 Osteoarthritis of knee, unspecified: Secondary | ICD-10-CM | POA: Diagnosis not present

## 2015-02-07 DIAGNOSIS — D510 Vitamin B12 deficiency anemia due to intrinsic factor deficiency: Secondary | ICD-10-CM | POA: Diagnosis not present

## 2015-02-07 DIAGNOSIS — Z96651 Presence of right artificial knee joint: Secondary | ICD-10-CM | POA: Diagnosis not present

## 2015-02-07 DIAGNOSIS — R5383 Other fatigue: Secondary | ICD-10-CM | POA: Diagnosis not present

## 2015-02-07 DIAGNOSIS — G40909 Epilepsy, unspecified, not intractable, without status epilepticus: Secondary | ICD-10-CM | POA: Diagnosis not present

## 2015-02-07 DIAGNOSIS — I10 Essential (primary) hypertension: Secondary | ICD-10-CM | POA: Diagnosis not present

## 2015-02-07 DIAGNOSIS — Z471 Aftercare following joint replacement surgery: Secondary | ICD-10-CM | POA: Diagnosis not present

## 2015-02-08 DIAGNOSIS — M25361 Other instability, right knee: Secondary | ICD-10-CM | POA: Diagnosis not present

## 2015-02-08 DIAGNOSIS — G40909 Epilepsy, unspecified, not intractable, without status epilepticus: Secondary | ICD-10-CM | POA: Diagnosis not present

## 2015-02-08 DIAGNOSIS — Z471 Aftercare following joint replacement surgery: Secondary | ICD-10-CM | POA: Diagnosis not present

## 2015-02-08 DIAGNOSIS — D510 Vitamin B12 deficiency anemia due to intrinsic factor deficiency: Secondary | ICD-10-CM | POA: Diagnosis not present

## 2015-02-08 DIAGNOSIS — Z96651 Presence of right artificial knee joint: Secondary | ICD-10-CM | POA: Diagnosis not present

## 2015-02-08 DIAGNOSIS — I10 Essential (primary) hypertension: Secondary | ICD-10-CM | POA: Diagnosis not present

## 2015-02-12 DIAGNOSIS — S76119A Strain of unspecified quadriceps muscle, fascia and tendon, initial encounter: Secondary | ICD-10-CM | POA: Diagnosis not present

## 2015-02-12 DIAGNOSIS — G40909 Epilepsy, unspecified, not intractable, without status epilepticus: Secondary | ICD-10-CM | POA: Diagnosis not present

## 2015-02-12 DIAGNOSIS — Z471 Aftercare following joint replacement surgery: Secondary | ICD-10-CM | POA: Diagnosis not present

## 2015-02-12 DIAGNOSIS — M25361 Other instability, right knee: Secondary | ICD-10-CM | POA: Diagnosis not present

## 2015-02-12 DIAGNOSIS — I10 Essential (primary) hypertension: Secondary | ICD-10-CM | POA: Diagnosis not present

## 2015-02-12 DIAGNOSIS — D510 Vitamin B12 deficiency anemia due to intrinsic factor deficiency: Secondary | ICD-10-CM | POA: Diagnosis not present

## 2015-02-12 DIAGNOSIS — Z96651 Presence of right artificial knee joint: Secondary | ICD-10-CM | POA: Diagnosis not present

## 2015-02-13 DIAGNOSIS — D510 Vitamin B12 deficiency anemia due to intrinsic factor deficiency: Secondary | ICD-10-CM | POA: Diagnosis not present

## 2015-02-13 DIAGNOSIS — Z471 Aftercare following joint replacement surgery: Secondary | ICD-10-CM | POA: Diagnosis not present

## 2015-02-13 DIAGNOSIS — M25361 Other instability, right knee: Secondary | ICD-10-CM | POA: Diagnosis not present

## 2015-02-13 DIAGNOSIS — I10 Essential (primary) hypertension: Secondary | ICD-10-CM | POA: Diagnosis not present

## 2015-02-13 DIAGNOSIS — Z96651 Presence of right artificial knee joint: Secondary | ICD-10-CM | POA: Diagnosis not present

## 2015-02-13 DIAGNOSIS — G40909 Epilepsy, unspecified, not intractable, without status epilepticus: Secondary | ICD-10-CM | POA: Diagnosis not present

## 2015-02-14 DIAGNOSIS — I252 Old myocardial infarction: Secondary | ICD-10-CM | POA: Diagnosis not present

## 2015-02-14 DIAGNOSIS — Z01810 Encounter for preprocedural cardiovascular examination: Secondary | ICD-10-CM | POA: Diagnosis not present

## 2015-02-14 DIAGNOSIS — M79609 Pain in unspecified limb: Secondary | ICD-10-CM | POA: Diagnosis not present

## 2015-02-14 DIAGNOSIS — Z01818 Encounter for other preprocedural examination: Secondary | ICD-10-CM | POA: Diagnosis not present

## 2015-02-14 DIAGNOSIS — I4510 Unspecified right bundle-branch block: Secondary | ICD-10-CM | POA: Diagnosis not present

## 2015-02-14 DIAGNOSIS — S76191S Other specified injury of right quadriceps muscle, fascia and tendon, sequela: Secondary | ICD-10-CM | POA: Diagnosis not present

## 2015-02-14 DIAGNOSIS — Z79899 Other long term (current) drug therapy: Secondary | ICD-10-CM | POA: Diagnosis not present

## 2015-02-15 DIAGNOSIS — M25361 Other instability, right knee: Secondary | ICD-10-CM | POA: Diagnosis not present

## 2015-02-15 DIAGNOSIS — Z471 Aftercare following joint replacement surgery: Secondary | ICD-10-CM | POA: Diagnosis not present

## 2015-02-15 DIAGNOSIS — G40909 Epilepsy, unspecified, not intractable, without status epilepticus: Secondary | ICD-10-CM | POA: Diagnosis not present

## 2015-02-15 DIAGNOSIS — Z96651 Presence of right artificial knee joint: Secondary | ICD-10-CM | POA: Diagnosis not present

## 2015-02-15 DIAGNOSIS — I10 Essential (primary) hypertension: Secondary | ICD-10-CM | POA: Diagnosis not present

## 2015-02-15 DIAGNOSIS — D510 Vitamin B12 deficiency anemia due to intrinsic factor deficiency: Secondary | ICD-10-CM | POA: Diagnosis not present

## 2015-02-18 DIAGNOSIS — D510 Vitamin B12 deficiency anemia due to intrinsic factor deficiency: Secondary | ICD-10-CM | POA: Diagnosis not present

## 2015-02-18 DIAGNOSIS — Z471 Aftercare following joint replacement surgery: Secondary | ICD-10-CM | POA: Diagnosis not present

## 2015-02-18 DIAGNOSIS — M25361 Other instability, right knee: Secondary | ICD-10-CM | POA: Diagnosis not present

## 2015-02-18 DIAGNOSIS — I10 Essential (primary) hypertension: Secondary | ICD-10-CM | POA: Diagnosis not present

## 2015-02-18 DIAGNOSIS — G40909 Epilepsy, unspecified, not intractable, without status epilepticus: Secondary | ICD-10-CM | POA: Diagnosis not present

## 2015-02-18 DIAGNOSIS — Z96651 Presence of right artificial knee joint: Secondary | ICD-10-CM | POA: Diagnosis not present

## 2015-02-20 DIAGNOSIS — I10 Essential (primary) hypertension: Secondary | ICD-10-CM | POA: Diagnosis not present

## 2015-02-20 DIAGNOSIS — Z471 Aftercare following joint replacement surgery: Secondary | ICD-10-CM | POA: Diagnosis not present

## 2015-02-20 DIAGNOSIS — G40909 Epilepsy, unspecified, not intractable, without status epilepticus: Secondary | ICD-10-CM | POA: Diagnosis not present

## 2015-02-20 DIAGNOSIS — Z96651 Presence of right artificial knee joint: Secondary | ICD-10-CM | POA: Diagnosis not present

## 2015-02-20 DIAGNOSIS — M25361 Other instability, right knee: Secondary | ICD-10-CM | POA: Diagnosis not present

## 2015-02-20 DIAGNOSIS — D510 Vitamin B12 deficiency anemia due to intrinsic factor deficiency: Secondary | ICD-10-CM | POA: Diagnosis not present

## 2015-02-21 DIAGNOSIS — Z96651 Presence of right artificial knee joint: Secondary | ICD-10-CM | POA: Diagnosis not present

## 2015-02-21 DIAGNOSIS — Z79891 Long term (current) use of opiate analgesic: Secondary | ICD-10-CM | POA: Diagnosis not present

## 2015-02-21 DIAGNOSIS — Z79899 Other long term (current) drug therapy: Secondary | ICD-10-CM | POA: Diagnosis not present

## 2015-02-21 DIAGNOSIS — Z7982 Long term (current) use of aspirin: Secondary | ICD-10-CM | POA: Diagnosis not present

## 2015-02-21 DIAGNOSIS — Z888 Allergy status to other drugs, medicaments and biological substances status: Secondary | ICD-10-CM | POA: Diagnosis not present

## 2015-02-21 DIAGNOSIS — I10 Essential (primary) hypertension: Secondary | ICD-10-CM | POA: Diagnosis present

## 2015-02-21 DIAGNOSIS — Z9181 History of falling: Secondary | ICD-10-CM | POA: Diagnosis not present

## 2015-02-21 DIAGNOSIS — F0390 Unspecified dementia without behavioral disturbance: Secondary | ICD-10-CM | POA: Diagnosis not present

## 2015-02-21 DIAGNOSIS — S76111A Strain of right quadriceps muscle, fascia and tendon, initial encounter: Secondary | ICD-10-CM | POA: Diagnosis not present

## 2015-02-21 DIAGNOSIS — Z96653 Presence of artificial knee joint, bilateral: Secondary | ICD-10-CM | POA: Diagnosis present

## 2015-02-21 DIAGNOSIS — K219 Gastro-esophageal reflux disease without esophagitis: Secondary | ICD-10-CM | POA: Diagnosis present

## 2015-02-26 DIAGNOSIS — I10 Essential (primary) hypertension: Secondary | ICD-10-CM | POA: Diagnosis not present

## 2015-02-26 DIAGNOSIS — M6281 Muscle weakness (generalized): Secondary | ICD-10-CM | POA: Diagnosis not present

## 2015-02-26 DIAGNOSIS — R2689 Other abnormalities of gait and mobility: Secondary | ICD-10-CM | POA: Diagnosis not present

## 2015-02-26 DIAGNOSIS — M1990 Unspecified osteoarthritis, unspecified site: Secondary | ICD-10-CM | POA: Diagnosis not present

## 2015-02-26 DIAGNOSIS — Z7982 Long term (current) use of aspirin: Secondary | ICD-10-CM | POA: Diagnosis not present

## 2015-02-26 DIAGNOSIS — D510 Vitamin B12 deficiency anemia due to intrinsic factor deficiency: Secondary | ICD-10-CM | POA: Diagnosis not present

## 2015-02-26 DIAGNOSIS — Z96651 Presence of right artificial knee joint: Secondary | ICD-10-CM | POA: Diagnosis not present

## 2015-02-26 DIAGNOSIS — Z9181 History of falling: Secondary | ICD-10-CM | POA: Diagnosis not present

## 2015-02-26 DIAGNOSIS — Z79899 Other long term (current) drug therapy: Secondary | ICD-10-CM | POA: Diagnosis not present

## 2015-02-26 DIAGNOSIS — S76191D Other specified injury of right quadriceps muscle, fascia and tendon, subsequent encounter: Secondary | ICD-10-CM | POA: Diagnosis not present

## 2015-02-26 DIAGNOSIS — G40909 Epilepsy, unspecified, not intractable, without status epilepticus: Secondary | ICD-10-CM | POA: Diagnosis not present

## 2015-02-28 DIAGNOSIS — D510 Vitamin B12 deficiency anemia due to intrinsic factor deficiency: Secondary | ICD-10-CM | POA: Diagnosis not present

## 2015-02-28 DIAGNOSIS — S76191D Other specified injury of right quadriceps muscle, fascia and tendon, subsequent encounter: Secondary | ICD-10-CM | POA: Diagnosis not present

## 2015-02-28 DIAGNOSIS — M1990 Unspecified osteoarthritis, unspecified site: Secondary | ICD-10-CM | POA: Diagnosis not present

## 2015-02-28 DIAGNOSIS — R2689 Other abnormalities of gait and mobility: Secondary | ICD-10-CM | POA: Diagnosis not present

## 2015-02-28 DIAGNOSIS — I10 Essential (primary) hypertension: Secondary | ICD-10-CM | POA: Diagnosis not present

## 2015-02-28 DIAGNOSIS — M6281 Muscle weakness (generalized): Secondary | ICD-10-CM | POA: Diagnosis not present

## 2015-03-01 DIAGNOSIS — M6281 Muscle weakness (generalized): Secondary | ICD-10-CM | POA: Diagnosis not present

## 2015-03-01 DIAGNOSIS — I10 Essential (primary) hypertension: Secondary | ICD-10-CM | POA: Diagnosis not present

## 2015-03-01 DIAGNOSIS — S76191D Other specified injury of right quadriceps muscle, fascia and tendon, subsequent encounter: Secondary | ICD-10-CM | POA: Diagnosis not present

## 2015-03-01 DIAGNOSIS — R2689 Other abnormalities of gait and mobility: Secondary | ICD-10-CM | POA: Diagnosis not present

## 2015-03-01 DIAGNOSIS — D510 Vitamin B12 deficiency anemia due to intrinsic factor deficiency: Secondary | ICD-10-CM | POA: Diagnosis not present

## 2015-03-01 DIAGNOSIS — M1990 Unspecified osteoarthritis, unspecified site: Secondary | ICD-10-CM | POA: Diagnosis not present

## 2015-03-04 DIAGNOSIS — M6281 Muscle weakness (generalized): Secondary | ICD-10-CM | POA: Diagnosis not present

## 2015-03-04 DIAGNOSIS — I10 Essential (primary) hypertension: Secondary | ICD-10-CM | POA: Diagnosis not present

## 2015-03-04 DIAGNOSIS — M1990 Unspecified osteoarthritis, unspecified site: Secondary | ICD-10-CM | POA: Diagnosis not present

## 2015-03-04 DIAGNOSIS — R2689 Other abnormalities of gait and mobility: Secondary | ICD-10-CM | POA: Diagnosis not present

## 2015-03-04 DIAGNOSIS — S76191D Other specified injury of right quadriceps muscle, fascia and tendon, subsequent encounter: Secondary | ICD-10-CM | POA: Diagnosis not present

## 2015-03-04 DIAGNOSIS — D510 Vitamin B12 deficiency anemia due to intrinsic factor deficiency: Secondary | ICD-10-CM | POA: Diagnosis not present

## 2015-03-05 DIAGNOSIS — I10 Essential (primary) hypertension: Secondary | ICD-10-CM | POA: Diagnosis not present

## 2015-03-05 DIAGNOSIS — D510 Vitamin B12 deficiency anemia due to intrinsic factor deficiency: Secondary | ICD-10-CM | POA: Diagnosis not present

## 2015-03-05 DIAGNOSIS — S76191D Other specified injury of right quadriceps muscle, fascia and tendon, subsequent encounter: Secondary | ICD-10-CM | POA: Diagnosis not present

## 2015-03-05 DIAGNOSIS — M1990 Unspecified osteoarthritis, unspecified site: Secondary | ICD-10-CM | POA: Diagnosis not present

## 2015-03-05 DIAGNOSIS — M6281 Muscle weakness (generalized): Secondary | ICD-10-CM | POA: Diagnosis not present

## 2015-03-05 DIAGNOSIS — R2689 Other abnormalities of gait and mobility: Secondary | ICD-10-CM | POA: Diagnosis not present

## 2015-03-06 DIAGNOSIS — Z79899 Other long term (current) drug therapy: Secondary | ICD-10-CM | POA: Diagnosis not present

## 2015-03-07 DIAGNOSIS — I10 Essential (primary) hypertension: Secondary | ICD-10-CM | POA: Diagnosis not present

## 2015-03-07 DIAGNOSIS — D510 Vitamin B12 deficiency anemia due to intrinsic factor deficiency: Secondary | ICD-10-CM | POA: Diagnosis not present

## 2015-03-07 DIAGNOSIS — R2689 Other abnormalities of gait and mobility: Secondary | ICD-10-CM | POA: Diagnosis not present

## 2015-03-07 DIAGNOSIS — M1990 Unspecified osteoarthritis, unspecified site: Secondary | ICD-10-CM | POA: Diagnosis not present

## 2015-03-07 DIAGNOSIS — S76191D Other specified injury of right quadriceps muscle, fascia and tendon, subsequent encounter: Secondary | ICD-10-CM | POA: Diagnosis not present

## 2015-03-07 DIAGNOSIS — M6281 Muscle weakness (generalized): Secondary | ICD-10-CM | POA: Diagnosis not present

## 2015-03-08 DIAGNOSIS — D510 Vitamin B12 deficiency anemia due to intrinsic factor deficiency: Secondary | ICD-10-CM | POA: Diagnosis not present

## 2015-03-08 DIAGNOSIS — I10 Essential (primary) hypertension: Secondary | ICD-10-CM | POA: Diagnosis not present

## 2015-03-08 DIAGNOSIS — R2689 Other abnormalities of gait and mobility: Secondary | ICD-10-CM | POA: Diagnosis not present

## 2015-03-08 DIAGNOSIS — S76191D Other specified injury of right quadriceps muscle, fascia and tendon, subsequent encounter: Secondary | ICD-10-CM | POA: Diagnosis not present

## 2015-03-08 DIAGNOSIS — N958 Other specified menopausal and perimenopausal disorders: Secondary | ICD-10-CM | POA: Diagnosis not present

## 2015-03-08 DIAGNOSIS — M1990 Unspecified osteoarthritis, unspecified site: Secondary | ICD-10-CM | POA: Diagnosis not present

## 2015-03-08 DIAGNOSIS — M6281 Muscle weakness (generalized): Secondary | ICD-10-CM | POA: Diagnosis not present

## 2015-03-11 DIAGNOSIS — G40109 Localization-related (focal) (partial) symptomatic epilepsy and epileptic syndromes with simple partial seizures, not intractable, without status epilepticus: Secondary | ICD-10-CM | POA: Diagnosis not present

## 2015-03-11 DIAGNOSIS — F330 Major depressive disorder, recurrent, mild: Secondary | ICD-10-CM | POA: Diagnosis not present

## 2015-03-11 DIAGNOSIS — F411 Generalized anxiety disorder: Secondary | ICD-10-CM | POA: Diagnosis not present

## 2015-03-12 DIAGNOSIS — R2689 Other abnormalities of gait and mobility: Secondary | ICD-10-CM | POA: Diagnosis not present

## 2015-03-12 DIAGNOSIS — M1990 Unspecified osteoarthritis, unspecified site: Secondary | ICD-10-CM | POA: Diagnosis not present

## 2015-03-12 DIAGNOSIS — D510 Vitamin B12 deficiency anemia due to intrinsic factor deficiency: Secondary | ICD-10-CM | POA: Diagnosis not present

## 2015-03-12 DIAGNOSIS — M6281 Muscle weakness (generalized): Secondary | ICD-10-CM | POA: Diagnosis not present

## 2015-03-12 DIAGNOSIS — I10 Essential (primary) hypertension: Secondary | ICD-10-CM | POA: Diagnosis not present

## 2015-03-12 DIAGNOSIS — S76191D Other specified injury of right quadriceps muscle, fascia and tendon, subsequent encounter: Secondary | ICD-10-CM | POA: Diagnosis not present

## 2015-03-13 DIAGNOSIS — R2689 Other abnormalities of gait and mobility: Secondary | ICD-10-CM | POA: Diagnosis not present

## 2015-03-13 DIAGNOSIS — M1990 Unspecified osteoarthritis, unspecified site: Secondary | ICD-10-CM | POA: Diagnosis not present

## 2015-03-13 DIAGNOSIS — M6281 Muscle weakness (generalized): Secondary | ICD-10-CM | POA: Diagnosis not present

## 2015-03-13 DIAGNOSIS — S76191D Other specified injury of right quadriceps muscle, fascia and tendon, subsequent encounter: Secondary | ICD-10-CM | POA: Diagnosis not present

## 2015-03-13 DIAGNOSIS — I10 Essential (primary) hypertension: Secondary | ICD-10-CM | POA: Diagnosis not present

## 2015-03-13 DIAGNOSIS — D510 Vitamin B12 deficiency anemia due to intrinsic factor deficiency: Secondary | ICD-10-CM | POA: Diagnosis not present

## 2015-03-14 DIAGNOSIS — D510 Vitamin B12 deficiency anemia due to intrinsic factor deficiency: Secondary | ICD-10-CM | POA: Diagnosis not present

## 2015-03-14 DIAGNOSIS — I10 Essential (primary) hypertension: Secondary | ICD-10-CM | POA: Diagnosis not present

## 2015-03-14 DIAGNOSIS — S76191D Other specified injury of right quadriceps muscle, fascia and tendon, subsequent encounter: Secondary | ICD-10-CM | POA: Diagnosis not present

## 2015-03-14 DIAGNOSIS — M1990 Unspecified osteoarthritis, unspecified site: Secondary | ICD-10-CM | POA: Diagnosis not present

## 2015-03-14 DIAGNOSIS — M6281 Muscle weakness (generalized): Secondary | ICD-10-CM | POA: Diagnosis not present

## 2015-03-14 DIAGNOSIS — W010XXA Fall on same level from slipping, tripping and stumbling without subsequent striking against object, initial encounter: Secondary | ICD-10-CM | POA: Diagnosis not present

## 2015-03-14 DIAGNOSIS — M818 Other osteoporosis without current pathological fracture: Secondary | ICD-10-CM | POA: Diagnosis not present

## 2015-03-14 DIAGNOSIS — R2689 Other abnormalities of gait and mobility: Secondary | ICD-10-CM | POA: Diagnosis not present

## 2015-03-15 DIAGNOSIS — I10 Essential (primary) hypertension: Secondary | ICD-10-CM | POA: Diagnosis not present

## 2015-03-15 DIAGNOSIS — M6281 Muscle weakness (generalized): Secondary | ICD-10-CM | POA: Diagnosis not present

## 2015-03-15 DIAGNOSIS — S76191D Other specified injury of right quadriceps muscle, fascia and tendon, subsequent encounter: Secondary | ICD-10-CM | POA: Diagnosis not present

## 2015-03-15 DIAGNOSIS — M1990 Unspecified osteoarthritis, unspecified site: Secondary | ICD-10-CM | POA: Diagnosis not present

## 2015-03-15 DIAGNOSIS — D510 Vitamin B12 deficiency anemia due to intrinsic factor deficiency: Secondary | ICD-10-CM | POA: Diagnosis not present

## 2015-03-15 DIAGNOSIS — R2689 Other abnormalities of gait and mobility: Secondary | ICD-10-CM | POA: Diagnosis not present

## 2015-03-18 DIAGNOSIS — S76191D Other specified injury of right quadriceps muscle, fascia and tendon, subsequent encounter: Secondary | ICD-10-CM | POA: Diagnosis not present

## 2015-03-18 DIAGNOSIS — M6281 Muscle weakness (generalized): Secondary | ICD-10-CM | POA: Diagnosis not present

## 2015-03-18 DIAGNOSIS — I10 Essential (primary) hypertension: Secondary | ICD-10-CM | POA: Diagnosis not present

## 2015-03-18 DIAGNOSIS — M1990 Unspecified osteoarthritis, unspecified site: Secondary | ICD-10-CM | POA: Diagnosis not present

## 2015-03-18 DIAGNOSIS — R2689 Other abnormalities of gait and mobility: Secondary | ICD-10-CM | POA: Diagnosis not present

## 2015-03-18 DIAGNOSIS — D510 Vitamin B12 deficiency anemia due to intrinsic factor deficiency: Secondary | ICD-10-CM | POA: Diagnosis not present

## 2015-03-19 DIAGNOSIS — R2689 Other abnormalities of gait and mobility: Secondary | ICD-10-CM | POA: Diagnosis not present

## 2015-03-19 DIAGNOSIS — S76191D Other specified injury of right quadriceps muscle, fascia and tendon, subsequent encounter: Secondary | ICD-10-CM | POA: Diagnosis not present

## 2015-03-19 DIAGNOSIS — M6281 Muscle weakness (generalized): Secondary | ICD-10-CM | POA: Diagnosis not present

## 2015-03-19 DIAGNOSIS — D510 Vitamin B12 deficiency anemia due to intrinsic factor deficiency: Secondary | ICD-10-CM | POA: Diagnosis not present

## 2015-03-19 DIAGNOSIS — M1990 Unspecified osteoarthritis, unspecified site: Secondary | ICD-10-CM | POA: Diagnosis not present

## 2015-03-19 DIAGNOSIS — I10 Essential (primary) hypertension: Secondary | ICD-10-CM | POA: Diagnosis not present

## 2015-03-20 DIAGNOSIS — D649 Anemia, unspecified: Secondary | ICD-10-CM | POA: Diagnosis not present

## 2015-03-20 DIAGNOSIS — I10 Essential (primary) hypertension: Secondary | ICD-10-CM | POA: Diagnosis not present

## 2015-03-20 DIAGNOSIS — Z79899 Other long term (current) drug therapy: Secondary | ICD-10-CM | POA: Diagnosis not present

## 2015-03-21 DIAGNOSIS — R2689 Other abnormalities of gait and mobility: Secondary | ICD-10-CM | POA: Diagnosis not present

## 2015-03-21 DIAGNOSIS — R5383 Other fatigue: Secondary | ICD-10-CM | POA: Diagnosis not present

## 2015-03-21 DIAGNOSIS — M1990 Unspecified osteoarthritis, unspecified site: Secondary | ICD-10-CM | POA: Diagnosis not present

## 2015-03-21 DIAGNOSIS — M6281 Muscle weakness (generalized): Secondary | ICD-10-CM | POA: Diagnosis not present

## 2015-03-21 DIAGNOSIS — D510 Vitamin B12 deficiency anemia due to intrinsic factor deficiency: Secondary | ICD-10-CM | POA: Diagnosis not present

## 2015-03-21 DIAGNOSIS — S76191D Other specified injury of right quadriceps muscle, fascia and tendon, subsequent encounter: Secondary | ICD-10-CM | POA: Diagnosis not present

## 2015-03-21 DIAGNOSIS — R4182 Altered mental status, unspecified: Secondary | ICD-10-CM | POA: Diagnosis not present

## 2015-03-21 DIAGNOSIS — D649 Anemia, unspecified: Secondary | ICD-10-CM | POA: Diagnosis not present

## 2015-03-21 DIAGNOSIS — I10 Essential (primary) hypertension: Secondary | ICD-10-CM | POA: Diagnosis not present

## 2015-03-25 DIAGNOSIS — M6281 Muscle weakness (generalized): Secondary | ICD-10-CM | POA: Diagnosis not present

## 2015-03-25 DIAGNOSIS — D649 Anemia, unspecified: Secondary | ICD-10-CM | POA: Diagnosis not present

## 2015-03-25 DIAGNOSIS — M1990 Unspecified osteoarthritis, unspecified site: Secondary | ICD-10-CM | POA: Diagnosis not present

## 2015-03-25 DIAGNOSIS — S76191D Other specified injury of right quadriceps muscle, fascia and tendon, subsequent encounter: Secondary | ICD-10-CM | POA: Diagnosis not present

## 2015-03-25 DIAGNOSIS — I10 Essential (primary) hypertension: Secondary | ICD-10-CM | POA: Diagnosis not present

## 2015-03-25 DIAGNOSIS — R2689 Other abnormalities of gait and mobility: Secondary | ICD-10-CM | POA: Diagnosis not present

## 2015-03-25 DIAGNOSIS — D510 Vitamin B12 deficiency anemia due to intrinsic factor deficiency: Secondary | ICD-10-CM | POA: Diagnosis not present

## 2015-03-26 DIAGNOSIS — S098XXA Other specified injuries of head, initial encounter: Secondary | ICD-10-CM | POA: Diagnosis not present

## 2015-03-26 DIAGNOSIS — K5900 Constipation, unspecified: Secondary | ICD-10-CM | POA: Diagnosis not present

## 2015-03-26 DIAGNOSIS — F418 Other specified anxiety disorders: Secondary | ICD-10-CM | POA: Diagnosis present

## 2015-03-26 DIAGNOSIS — R4182 Altered mental status, unspecified: Secondary | ICD-10-CM | POA: Diagnosis not present

## 2015-03-26 DIAGNOSIS — R339 Retention of urine, unspecified: Secondary | ICD-10-CM | POA: Diagnosis not present

## 2015-03-26 DIAGNOSIS — N390 Urinary tract infection, site not specified: Secondary | ICD-10-CM | POA: Diagnosis not present

## 2015-03-26 DIAGNOSIS — I10 Essential (primary) hypertension: Secondary | ICD-10-CM | POA: Diagnosis not present

## 2015-03-26 DIAGNOSIS — Z79899 Other long term (current) drug therapy: Secondary | ICD-10-CM | POA: Diagnosis not present

## 2015-03-26 DIAGNOSIS — M25561 Pain in right knee: Secondary | ICD-10-CM | POA: Diagnosis not present

## 2015-03-26 DIAGNOSIS — T420X4A Poisoning by hydantoin derivatives, undetermined, initial encounter: Secondary | ICD-10-CM | POA: Diagnosis not present

## 2015-03-26 DIAGNOSIS — F0390 Unspecified dementia without behavioral disturbance: Secondary | ICD-10-CM | POA: Diagnosis not present

## 2015-03-26 DIAGNOSIS — R0689 Other abnormalities of breathing: Secondary | ICD-10-CM | POA: Diagnosis not present

## 2015-03-26 DIAGNOSIS — R569 Unspecified convulsions: Secondary | ICD-10-CM | POA: Diagnosis present

## 2015-03-26 DIAGNOSIS — Z881 Allergy status to other antibiotic agents status: Secondary | ICD-10-CM | POA: Diagnosis not present

## 2015-03-26 DIAGNOSIS — T420X5A Adverse effect of hydantoin derivatives, initial encounter: Secondary | ICD-10-CM | POA: Diagnosis present

## 2015-03-26 DIAGNOSIS — S299XXA Unspecified injury of thorax, initial encounter: Secondary | ICD-10-CM | POA: Diagnosis not present

## 2015-03-26 DIAGNOSIS — E119 Type 2 diabetes mellitus without complications: Secondary | ICD-10-CM | POA: Diagnosis not present

## 2015-03-26 DIAGNOSIS — G92 Toxic encephalopathy: Secondary | ICD-10-CM | POA: Diagnosis not present

## 2015-03-26 DIAGNOSIS — K5901 Slow transit constipation: Secondary | ICD-10-CM | POA: Diagnosis not present

## 2015-03-26 DIAGNOSIS — E871 Hypo-osmolality and hyponatremia: Secondary | ICD-10-CM | POA: Diagnosis present

## 2015-03-26 DIAGNOSIS — J181 Lobar pneumonia, unspecified organism: Secondary | ICD-10-CM | POA: Diagnosis not present

## 2015-03-26 DIAGNOSIS — E780 Pure hypercholesterolemia: Secondary | ICD-10-CM | POA: Diagnosis present

## 2015-03-26 DIAGNOSIS — M1990 Unspecified osteoarthritis, unspecified site: Secondary | ICD-10-CM | POA: Diagnosis present

## 2015-03-26 DIAGNOSIS — Z79891 Long term (current) use of opiate analgesic: Secondary | ICD-10-CM | POA: Diagnosis not present

## 2015-03-26 DIAGNOSIS — J189 Pneumonia, unspecified organism: Secondary | ICD-10-CM | POA: Diagnosis not present

## 2015-03-26 DIAGNOSIS — J156 Pneumonia due to other aerobic Gram-negative bacteria: Secondary | ICD-10-CM | POA: Diagnosis not present

## 2015-03-26 DIAGNOSIS — R0902 Hypoxemia: Secondary | ICD-10-CM | POA: Diagnosis not present

## 2015-03-26 DIAGNOSIS — Z7982 Long term (current) use of aspirin: Secondary | ICD-10-CM | POA: Diagnosis not present

## 2015-03-26 DIAGNOSIS — T420X1A Poisoning by hydantoin derivatives, accidental (unintentional), initial encounter: Secondary | ICD-10-CM | POA: Diagnosis not present

## 2015-03-26 DIAGNOSIS — D649 Anemia, unspecified: Secondary | ICD-10-CM | POA: Diagnosis not present

## 2015-03-26 DIAGNOSIS — S0990XA Unspecified injury of head, initial encounter: Secondary | ICD-10-CM | POA: Diagnosis not present

## 2015-04-02 DIAGNOSIS — D510 Vitamin B12 deficiency anemia due to intrinsic factor deficiency: Secondary | ICD-10-CM | POA: Diagnosis not present

## 2015-04-02 DIAGNOSIS — M1990 Unspecified osteoarthritis, unspecified site: Secondary | ICD-10-CM | POA: Diagnosis not present

## 2015-04-02 DIAGNOSIS — R2689 Other abnormalities of gait and mobility: Secondary | ICD-10-CM | POA: Diagnosis not present

## 2015-04-02 DIAGNOSIS — S76191D Other specified injury of right quadriceps muscle, fascia and tendon, subsequent encounter: Secondary | ICD-10-CM | POA: Diagnosis not present

## 2015-04-02 DIAGNOSIS — M6281 Muscle weakness (generalized): Secondary | ICD-10-CM | POA: Diagnosis not present

## 2015-04-02 DIAGNOSIS — I10 Essential (primary) hypertension: Secondary | ICD-10-CM | POA: Diagnosis not present

## 2015-04-04 DIAGNOSIS — D649 Anemia, unspecified: Secondary | ICD-10-CM | POA: Diagnosis not present

## 2015-04-04 DIAGNOSIS — R5383 Other fatigue: Secondary | ICD-10-CM | POA: Diagnosis not present

## 2015-04-04 DIAGNOSIS — M1990 Unspecified osteoarthritis, unspecified site: Secondary | ICD-10-CM | POA: Diagnosis not present

## 2015-04-04 DIAGNOSIS — I10 Essential (primary) hypertension: Secondary | ICD-10-CM | POA: Diagnosis not present

## 2015-04-04 DIAGNOSIS — R2689 Other abnormalities of gait and mobility: Secondary | ICD-10-CM | POA: Diagnosis not present

## 2015-04-04 DIAGNOSIS — S76191D Other specified injury of right quadriceps muscle, fascia and tendon, subsequent encounter: Secondary | ICD-10-CM | POA: Diagnosis not present

## 2015-04-04 DIAGNOSIS — D510 Vitamin B12 deficiency anemia due to intrinsic factor deficiency: Secondary | ICD-10-CM | POA: Diagnosis not present

## 2015-04-04 DIAGNOSIS — R4182 Altered mental status, unspecified: Secondary | ICD-10-CM | POA: Diagnosis not present

## 2015-04-04 DIAGNOSIS — M6281 Muscle weakness (generalized): Secondary | ICD-10-CM | POA: Diagnosis not present

## 2015-04-08 DIAGNOSIS — M1990 Unspecified osteoarthritis, unspecified site: Secondary | ICD-10-CM | POA: Diagnosis not present

## 2015-04-08 DIAGNOSIS — G40109 Localization-related (focal) (partial) symptomatic epilepsy and epileptic syndromes with simple partial seizures, not intractable, without status epilepticus: Secondary | ICD-10-CM | POA: Diagnosis not present

## 2015-04-08 DIAGNOSIS — D510 Vitamin B12 deficiency anemia due to intrinsic factor deficiency: Secondary | ICD-10-CM | POA: Diagnosis not present

## 2015-04-08 DIAGNOSIS — F411 Generalized anxiety disorder: Secondary | ICD-10-CM | POA: Diagnosis not present

## 2015-04-08 DIAGNOSIS — I10 Essential (primary) hypertension: Secondary | ICD-10-CM | POA: Diagnosis not present

## 2015-04-08 DIAGNOSIS — S76191D Other specified injury of right quadriceps muscle, fascia and tendon, subsequent encounter: Secondary | ICD-10-CM | POA: Diagnosis not present

## 2015-04-08 DIAGNOSIS — M6281 Muscle weakness (generalized): Secondary | ICD-10-CM | POA: Diagnosis not present

## 2015-04-08 DIAGNOSIS — F330 Major depressive disorder, recurrent, mild: Secondary | ICD-10-CM | POA: Diagnosis not present

## 2015-04-08 DIAGNOSIS — R2689 Other abnormalities of gait and mobility: Secondary | ICD-10-CM | POA: Diagnosis not present

## 2015-04-10 DIAGNOSIS — M6281 Muscle weakness (generalized): Secondary | ICD-10-CM | POA: Diagnosis not present

## 2015-04-10 DIAGNOSIS — R2689 Other abnormalities of gait and mobility: Secondary | ICD-10-CM | POA: Diagnosis not present

## 2015-04-10 DIAGNOSIS — S76191D Other specified injury of right quadriceps muscle, fascia and tendon, subsequent encounter: Secondary | ICD-10-CM | POA: Diagnosis not present

## 2015-04-10 DIAGNOSIS — I10 Essential (primary) hypertension: Secondary | ICD-10-CM | POA: Diagnosis not present

## 2015-04-10 DIAGNOSIS — D510 Vitamin B12 deficiency anemia due to intrinsic factor deficiency: Secondary | ICD-10-CM | POA: Diagnosis not present

## 2015-04-10 DIAGNOSIS — M1990 Unspecified osteoarthritis, unspecified site: Secondary | ICD-10-CM | POA: Diagnosis not present

## 2015-04-11 DIAGNOSIS — R2689 Other abnormalities of gait and mobility: Secondary | ICD-10-CM | POA: Diagnosis not present

## 2015-04-11 DIAGNOSIS — I10 Essential (primary) hypertension: Secondary | ICD-10-CM | POA: Diagnosis not present

## 2015-04-11 DIAGNOSIS — D510 Vitamin B12 deficiency anemia due to intrinsic factor deficiency: Secondary | ICD-10-CM | POA: Diagnosis not present

## 2015-04-11 DIAGNOSIS — D649 Anemia, unspecified: Secondary | ICD-10-CM | POA: Diagnosis not present

## 2015-04-11 DIAGNOSIS — M1990 Unspecified osteoarthritis, unspecified site: Secondary | ICD-10-CM | POA: Diagnosis not present

## 2015-04-11 DIAGNOSIS — M6281 Muscle weakness (generalized): Secondary | ICD-10-CM | POA: Diagnosis not present

## 2015-04-11 DIAGNOSIS — S76191D Other specified injury of right quadriceps muscle, fascia and tendon, subsequent encounter: Secondary | ICD-10-CM | POA: Diagnosis not present

## 2015-04-15 DIAGNOSIS — D510 Vitamin B12 deficiency anemia due to intrinsic factor deficiency: Secondary | ICD-10-CM | POA: Diagnosis not present

## 2015-04-15 DIAGNOSIS — I10 Essential (primary) hypertension: Secondary | ICD-10-CM | POA: Diagnosis not present

## 2015-04-15 DIAGNOSIS — S76191D Other specified injury of right quadriceps muscle, fascia and tendon, subsequent encounter: Secondary | ICD-10-CM | POA: Diagnosis not present

## 2015-04-15 DIAGNOSIS — M1990 Unspecified osteoarthritis, unspecified site: Secondary | ICD-10-CM | POA: Diagnosis not present

## 2015-04-15 DIAGNOSIS — M6281 Muscle weakness (generalized): Secondary | ICD-10-CM | POA: Diagnosis not present

## 2015-04-15 DIAGNOSIS — R2689 Other abnormalities of gait and mobility: Secondary | ICD-10-CM | POA: Diagnosis not present

## 2015-04-16 DIAGNOSIS — M6281 Muscle weakness (generalized): Secondary | ICD-10-CM | POA: Diagnosis not present

## 2015-04-16 DIAGNOSIS — M1990 Unspecified osteoarthritis, unspecified site: Secondary | ICD-10-CM | POA: Diagnosis not present

## 2015-04-16 DIAGNOSIS — S76191D Other specified injury of right quadriceps muscle, fascia and tendon, subsequent encounter: Secondary | ICD-10-CM | POA: Diagnosis not present

## 2015-04-16 DIAGNOSIS — N390 Urinary tract infection, site not specified: Secondary | ICD-10-CM | POA: Diagnosis not present

## 2015-04-16 DIAGNOSIS — I10 Essential (primary) hypertension: Secondary | ICD-10-CM | POA: Diagnosis not present

## 2015-04-16 DIAGNOSIS — D510 Vitamin B12 deficiency anemia due to intrinsic factor deficiency: Secondary | ICD-10-CM | POA: Diagnosis not present

## 2015-04-16 DIAGNOSIS — R2689 Other abnormalities of gait and mobility: Secondary | ICD-10-CM | POA: Diagnosis not present

## 2015-04-17 DIAGNOSIS — D649 Anemia, unspecified: Secondary | ICD-10-CM | POA: Diagnosis not present

## 2015-04-17 DIAGNOSIS — I10 Essential (primary) hypertension: Secondary | ICD-10-CM | POA: Diagnosis not present

## 2015-04-18 DIAGNOSIS — I10 Essential (primary) hypertension: Secondary | ICD-10-CM | POA: Diagnosis not present

## 2015-04-18 DIAGNOSIS — M1990 Unspecified osteoarthritis, unspecified site: Secondary | ICD-10-CM | POA: Diagnosis not present

## 2015-04-18 DIAGNOSIS — S76191D Other specified injury of right quadriceps muscle, fascia and tendon, subsequent encounter: Secondary | ICD-10-CM | POA: Diagnosis not present

## 2015-04-18 DIAGNOSIS — R2689 Other abnormalities of gait and mobility: Secondary | ICD-10-CM | POA: Diagnosis not present

## 2015-04-18 DIAGNOSIS — D510 Vitamin B12 deficiency anemia due to intrinsic factor deficiency: Secondary | ICD-10-CM | POA: Diagnosis not present

## 2015-04-18 DIAGNOSIS — M6281 Muscle weakness (generalized): Secondary | ICD-10-CM | POA: Diagnosis not present

## 2015-04-22 DIAGNOSIS — S76191D Other specified injury of right quadriceps muscle, fascia and tendon, subsequent encounter: Secondary | ICD-10-CM | POA: Diagnosis not present

## 2015-04-22 DIAGNOSIS — M6281 Muscle weakness (generalized): Secondary | ICD-10-CM | POA: Diagnosis not present

## 2015-04-22 DIAGNOSIS — D510 Vitamin B12 deficiency anemia due to intrinsic factor deficiency: Secondary | ICD-10-CM | POA: Diagnosis not present

## 2015-04-22 DIAGNOSIS — I10 Essential (primary) hypertension: Secondary | ICD-10-CM | POA: Diagnosis not present

## 2015-04-22 DIAGNOSIS — R2689 Other abnormalities of gait and mobility: Secondary | ICD-10-CM | POA: Diagnosis not present

## 2015-04-22 DIAGNOSIS — M1990 Unspecified osteoarthritis, unspecified site: Secondary | ICD-10-CM | POA: Diagnosis not present

## 2015-04-24 DIAGNOSIS — S76191D Other specified injury of right quadriceps muscle, fascia and tendon, subsequent encounter: Secondary | ICD-10-CM | POA: Diagnosis not present

## 2015-04-24 DIAGNOSIS — D510 Vitamin B12 deficiency anemia due to intrinsic factor deficiency: Secondary | ICD-10-CM | POA: Diagnosis not present

## 2015-04-24 DIAGNOSIS — I10 Essential (primary) hypertension: Secondary | ICD-10-CM | POA: Diagnosis not present

## 2015-04-24 DIAGNOSIS — R2689 Other abnormalities of gait and mobility: Secondary | ICD-10-CM | POA: Diagnosis not present

## 2015-04-24 DIAGNOSIS — M6281 Muscle weakness (generalized): Secondary | ICD-10-CM | POA: Diagnosis not present

## 2015-04-24 DIAGNOSIS — M1990 Unspecified osteoarthritis, unspecified site: Secondary | ICD-10-CM | POA: Diagnosis not present

## 2015-04-25 DIAGNOSIS — M6281 Muscle weakness (generalized): Secondary | ICD-10-CM | POA: Diagnosis not present

## 2015-04-25 DIAGNOSIS — M1990 Unspecified osteoarthritis, unspecified site: Secondary | ICD-10-CM | POA: Diagnosis not present

## 2015-04-25 DIAGNOSIS — D510 Vitamin B12 deficiency anemia due to intrinsic factor deficiency: Secondary | ICD-10-CM | POA: Diagnosis not present

## 2015-04-25 DIAGNOSIS — I10 Essential (primary) hypertension: Secondary | ICD-10-CM | POA: Diagnosis not present

## 2015-04-25 DIAGNOSIS — S76191D Other specified injury of right quadriceps muscle, fascia and tendon, subsequent encounter: Secondary | ICD-10-CM | POA: Diagnosis not present

## 2015-04-25 DIAGNOSIS — R2689 Other abnormalities of gait and mobility: Secondary | ICD-10-CM | POA: Diagnosis not present

## 2015-04-26 DIAGNOSIS — D510 Vitamin B12 deficiency anemia due to intrinsic factor deficiency: Secondary | ICD-10-CM | POA: Diagnosis not present

## 2015-04-26 DIAGNOSIS — S76191D Other specified injury of right quadriceps muscle, fascia and tendon, subsequent encounter: Secondary | ICD-10-CM | POA: Diagnosis not present

## 2015-04-26 DIAGNOSIS — I10 Essential (primary) hypertension: Secondary | ICD-10-CM | POA: Diagnosis not present

## 2015-04-26 DIAGNOSIS — M6281 Muscle weakness (generalized): Secondary | ICD-10-CM | POA: Diagnosis not present

## 2015-04-26 DIAGNOSIS — R2689 Other abnormalities of gait and mobility: Secondary | ICD-10-CM | POA: Diagnosis not present

## 2015-04-26 DIAGNOSIS — M1990 Unspecified osteoarthritis, unspecified site: Secondary | ICD-10-CM | POA: Diagnosis not present

## 2015-04-27 DIAGNOSIS — R260 Ataxic gait: Secondary | ICD-10-CM | POA: Diagnosis not present

## 2015-04-27 DIAGNOSIS — G40909 Epilepsy, unspecified, not intractable, without status epilepticus: Secondary | ICD-10-CM | POA: Diagnosis not present

## 2015-04-27 DIAGNOSIS — S76191D Other specified injury of right quadriceps muscle, fascia and tendon, subsequent encounter: Secondary | ICD-10-CM | POA: Diagnosis not present

## 2015-04-27 DIAGNOSIS — I10 Essential (primary) hypertension: Secondary | ICD-10-CM | POA: Diagnosis not present

## 2015-04-27 DIAGNOSIS — D510 Vitamin B12 deficiency anemia due to intrinsic factor deficiency: Secondary | ICD-10-CM | POA: Diagnosis not present

## 2015-04-27 DIAGNOSIS — M6281 Muscle weakness (generalized): Secondary | ICD-10-CM | POA: Diagnosis not present

## 2015-04-27 DIAGNOSIS — Z96651 Presence of right artificial knee joint: Secondary | ICD-10-CM | POA: Diagnosis not present

## 2015-04-27 DIAGNOSIS — M1990 Unspecified osteoarthritis, unspecified site: Secondary | ICD-10-CM | POA: Diagnosis not present

## 2015-04-27 DIAGNOSIS — Z9181 History of falling: Secondary | ICD-10-CM | POA: Diagnosis not present

## 2015-04-27 DIAGNOSIS — Z7982 Long term (current) use of aspirin: Secondary | ICD-10-CM | POA: Diagnosis not present

## 2015-04-27 DIAGNOSIS — Z79899 Other long term (current) drug therapy: Secondary | ICD-10-CM | POA: Diagnosis not present

## 2015-04-29 DIAGNOSIS — I10 Essential (primary) hypertension: Secondary | ICD-10-CM | POA: Diagnosis not present

## 2015-04-29 DIAGNOSIS — D510 Vitamin B12 deficiency anemia due to intrinsic factor deficiency: Secondary | ICD-10-CM | POA: Diagnosis not present

## 2015-04-29 DIAGNOSIS — M6281 Muscle weakness (generalized): Secondary | ICD-10-CM | POA: Diagnosis not present

## 2015-04-29 DIAGNOSIS — R260 Ataxic gait: Secondary | ICD-10-CM | POA: Diagnosis not present

## 2015-04-29 DIAGNOSIS — S76191D Other specified injury of right quadriceps muscle, fascia and tendon, subsequent encounter: Secondary | ICD-10-CM | POA: Diagnosis not present

## 2015-04-29 DIAGNOSIS — M1990 Unspecified osteoarthritis, unspecified site: Secondary | ICD-10-CM | POA: Diagnosis not present

## 2015-04-30 DIAGNOSIS — S76191D Other specified injury of right quadriceps muscle, fascia and tendon, subsequent encounter: Secondary | ICD-10-CM | POA: Diagnosis not present

## 2015-04-30 DIAGNOSIS — M1990 Unspecified osteoarthritis, unspecified site: Secondary | ICD-10-CM | POA: Diagnosis not present

## 2015-04-30 DIAGNOSIS — R260 Ataxic gait: Secondary | ICD-10-CM | POA: Diagnosis not present

## 2015-04-30 DIAGNOSIS — I10 Essential (primary) hypertension: Secondary | ICD-10-CM | POA: Diagnosis not present

## 2015-04-30 DIAGNOSIS — D510 Vitamin B12 deficiency anemia due to intrinsic factor deficiency: Secondary | ICD-10-CM | POA: Diagnosis not present

## 2015-04-30 DIAGNOSIS — M6281 Muscle weakness (generalized): Secondary | ICD-10-CM | POA: Diagnosis not present

## 2015-05-01 DIAGNOSIS — S76191D Other specified injury of right quadriceps muscle, fascia and tendon, subsequent encounter: Secondary | ICD-10-CM | POA: Diagnosis not present

## 2015-05-01 DIAGNOSIS — I10 Essential (primary) hypertension: Secondary | ICD-10-CM | POA: Diagnosis not present

## 2015-05-01 DIAGNOSIS — D510 Vitamin B12 deficiency anemia due to intrinsic factor deficiency: Secondary | ICD-10-CM | POA: Diagnosis not present

## 2015-05-01 DIAGNOSIS — R260 Ataxic gait: Secondary | ICD-10-CM | POA: Diagnosis not present

## 2015-05-01 DIAGNOSIS — M6281 Muscle weakness (generalized): Secondary | ICD-10-CM | POA: Diagnosis not present

## 2015-05-01 DIAGNOSIS — M1990 Unspecified osteoarthritis, unspecified site: Secondary | ICD-10-CM | POA: Diagnosis not present

## 2015-05-01 DIAGNOSIS — Z79899 Other long term (current) drug therapy: Secondary | ICD-10-CM | POA: Diagnosis not present

## 2015-05-02 DIAGNOSIS — D510 Vitamin B12 deficiency anemia due to intrinsic factor deficiency: Secondary | ICD-10-CM | POA: Diagnosis not present

## 2015-05-02 DIAGNOSIS — R260 Ataxic gait: Secondary | ICD-10-CM | POA: Diagnosis not present

## 2015-05-02 DIAGNOSIS — M6281 Muscle weakness (generalized): Secondary | ICD-10-CM | POA: Diagnosis not present

## 2015-05-02 DIAGNOSIS — I10 Essential (primary) hypertension: Secondary | ICD-10-CM | POA: Diagnosis not present

## 2015-05-02 DIAGNOSIS — S76191D Other specified injury of right quadriceps muscle, fascia and tendon, subsequent encounter: Secondary | ICD-10-CM | POA: Diagnosis not present

## 2015-05-02 DIAGNOSIS — M1990 Unspecified osteoarthritis, unspecified site: Secondary | ICD-10-CM | POA: Diagnosis not present

## 2015-05-06 DIAGNOSIS — F411 Generalized anxiety disorder: Secondary | ICD-10-CM | POA: Diagnosis not present

## 2015-05-06 DIAGNOSIS — G40109 Localization-related (focal) (partial) symptomatic epilepsy and epileptic syndromes with simple partial seizures, not intractable, without status epilepticus: Secondary | ICD-10-CM | POA: Diagnosis not present

## 2015-05-06 DIAGNOSIS — F330 Major depressive disorder, recurrent, mild: Secondary | ICD-10-CM | POA: Diagnosis not present

## 2015-05-07 DIAGNOSIS — R260 Ataxic gait: Secondary | ICD-10-CM | POA: Diagnosis not present

## 2015-05-07 DIAGNOSIS — D510 Vitamin B12 deficiency anemia due to intrinsic factor deficiency: Secondary | ICD-10-CM | POA: Diagnosis not present

## 2015-05-07 DIAGNOSIS — M6281 Muscle weakness (generalized): Secondary | ICD-10-CM | POA: Diagnosis not present

## 2015-05-07 DIAGNOSIS — I10 Essential (primary) hypertension: Secondary | ICD-10-CM | POA: Diagnosis not present

## 2015-05-07 DIAGNOSIS — S76191D Other specified injury of right quadriceps muscle, fascia and tendon, subsequent encounter: Secondary | ICD-10-CM | POA: Diagnosis not present

## 2015-05-07 DIAGNOSIS — M1990 Unspecified osteoarthritis, unspecified site: Secondary | ICD-10-CM | POA: Diagnosis not present

## 2015-05-08 DIAGNOSIS — M1990 Unspecified osteoarthritis, unspecified site: Secondary | ICD-10-CM | POA: Diagnosis not present

## 2015-05-08 DIAGNOSIS — I10 Essential (primary) hypertension: Secondary | ICD-10-CM | POA: Diagnosis not present

## 2015-05-08 DIAGNOSIS — D510 Vitamin B12 deficiency anemia due to intrinsic factor deficiency: Secondary | ICD-10-CM | POA: Diagnosis not present

## 2015-05-08 DIAGNOSIS — S76191D Other specified injury of right quadriceps muscle, fascia and tendon, subsequent encounter: Secondary | ICD-10-CM | POA: Diagnosis not present

## 2015-05-08 DIAGNOSIS — R260 Ataxic gait: Secondary | ICD-10-CM | POA: Diagnosis not present

## 2015-05-08 DIAGNOSIS — M6281 Muscle weakness (generalized): Secondary | ICD-10-CM | POA: Diagnosis not present

## 2015-05-09 DIAGNOSIS — Z9181 History of falling: Secondary | ICD-10-CM | POA: Diagnosis not present

## 2015-05-09 DIAGNOSIS — D510 Vitamin B12 deficiency anemia due to intrinsic factor deficiency: Secondary | ICD-10-CM | POA: Diagnosis not present

## 2015-05-09 DIAGNOSIS — M25561 Pain in right knee: Secondary | ICD-10-CM | POA: Diagnosis not present

## 2015-05-09 DIAGNOSIS — S76191D Other specified injury of right quadriceps muscle, fascia and tendon, subsequent encounter: Secondary | ICD-10-CM | POA: Diagnosis not present

## 2015-05-09 DIAGNOSIS — M1990 Unspecified osteoarthritis, unspecified site: Secondary | ICD-10-CM | POA: Diagnosis not present

## 2015-05-09 DIAGNOSIS — M6281 Muscle weakness (generalized): Secondary | ICD-10-CM | POA: Diagnosis not present

## 2015-05-09 DIAGNOSIS — R260 Ataxic gait: Secondary | ICD-10-CM | POA: Diagnosis not present

## 2015-05-09 DIAGNOSIS — I10 Essential (primary) hypertension: Secondary | ICD-10-CM | POA: Diagnosis not present

## 2015-05-09 DIAGNOSIS — M25551 Pain in right hip: Secondary | ICD-10-CM | POA: Diagnosis not present

## 2015-05-10 DIAGNOSIS — I10 Essential (primary) hypertension: Secondary | ICD-10-CM | POA: Diagnosis not present

## 2015-05-10 DIAGNOSIS — M6281 Muscle weakness (generalized): Secondary | ICD-10-CM | POA: Diagnosis not present

## 2015-05-10 DIAGNOSIS — D510 Vitamin B12 deficiency anemia due to intrinsic factor deficiency: Secondary | ICD-10-CM | POA: Diagnosis not present

## 2015-05-10 DIAGNOSIS — M1990 Unspecified osteoarthritis, unspecified site: Secondary | ICD-10-CM | POA: Diagnosis not present

## 2015-05-10 DIAGNOSIS — S76191D Other specified injury of right quadriceps muscle, fascia and tendon, subsequent encounter: Secondary | ICD-10-CM | POA: Diagnosis not present

## 2015-05-10 DIAGNOSIS — R260 Ataxic gait: Secondary | ICD-10-CM | POA: Diagnosis not present

## 2015-05-13 DIAGNOSIS — D510 Vitamin B12 deficiency anemia due to intrinsic factor deficiency: Secondary | ICD-10-CM | POA: Diagnosis not present

## 2015-05-13 DIAGNOSIS — M6281 Muscle weakness (generalized): Secondary | ICD-10-CM | POA: Diagnosis not present

## 2015-05-13 DIAGNOSIS — R260 Ataxic gait: Secondary | ICD-10-CM | POA: Diagnosis not present

## 2015-05-13 DIAGNOSIS — I10 Essential (primary) hypertension: Secondary | ICD-10-CM | POA: Diagnosis not present

## 2015-05-13 DIAGNOSIS — M1990 Unspecified osteoarthritis, unspecified site: Secondary | ICD-10-CM | POA: Diagnosis not present

## 2015-05-13 DIAGNOSIS — S76191D Other specified injury of right quadriceps muscle, fascia and tendon, subsequent encounter: Secondary | ICD-10-CM | POA: Diagnosis not present

## 2015-05-15 DIAGNOSIS — S76119A Strain of unspecified quadriceps muscle, fascia and tendon, initial encounter: Secondary | ICD-10-CM | POA: Diagnosis not present

## 2015-05-16 DIAGNOSIS — M1990 Unspecified osteoarthritis, unspecified site: Secondary | ICD-10-CM | POA: Diagnosis not present

## 2015-05-16 DIAGNOSIS — D510 Vitamin B12 deficiency anemia due to intrinsic factor deficiency: Secondary | ICD-10-CM | POA: Diagnosis not present

## 2015-05-16 DIAGNOSIS — R260 Ataxic gait: Secondary | ICD-10-CM | POA: Diagnosis not present

## 2015-05-16 DIAGNOSIS — S76191D Other specified injury of right quadriceps muscle, fascia and tendon, subsequent encounter: Secondary | ICD-10-CM | POA: Diagnosis not present

## 2015-05-16 DIAGNOSIS — M6281 Muscle weakness (generalized): Secondary | ICD-10-CM | POA: Diagnosis not present

## 2015-05-16 DIAGNOSIS — I10 Essential (primary) hypertension: Secondary | ICD-10-CM | POA: Diagnosis not present

## 2015-05-20 DIAGNOSIS — I10 Essential (primary) hypertension: Secondary | ICD-10-CM | POA: Diagnosis not present

## 2015-05-20 DIAGNOSIS — S76191D Other specified injury of right quadriceps muscle, fascia and tendon, subsequent encounter: Secondary | ICD-10-CM | POA: Diagnosis not present

## 2015-05-20 DIAGNOSIS — M6281 Muscle weakness (generalized): Secondary | ICD-10-CM | POA: Diagnosis not present

## 2015-05-20 DIAGNOSIS — R260 Ataxic gait: Secondary | ICD-10-CM | POA: Diagnosis not present

## 2015-05-20 DIAGNOSIS — D510 Vitamin B12 deficiency anemia due to intrinsic factor deficiency: Secondary | ICD-10-CM | POA: Diagnosis not present

## 2015-05-20 DIAGNOSIS — M1990 Unspecified osteoarthritis, unspecified site: Secondary | ICD-10-CM | POA: Diagnosis not present

## 2015-05-21 DIAGNOSIS — I10 Essential (primary) hypertension: Secondary | ICD-10-CM | POA: Diagnosis not present

## 2015-05-21 DIAGNOSIS — M1990 Unspecified osteoarthritis, unspecified site: Secondary | ICD-10-CM | POA: Diagnosis not present

## 2015-05-21 DIAGNOSIS — R260 Ataxic gait: Secondary | ICD-10-CM | POA: Diagnosis not present

## 2015-05-21 DIAGNOSIS — M6281 Muscle weakness (generalized): Secondary | ICD-10-CM | POA: Diagnosis not present

## 2015-05-21 DIAGNOSIS — D510 Vitamin B12 deficiency anemia due to intrinsic factor deficiency: Secondary | ICD-10-CM | POA: Diagnosis not present

## 2015-05-21 DIAGNOSIS — S76191D Other specified injury of right quadriceps muscle, fascia and tendon, subsequent encounter: Secondary | ICD-10-CM | POA: Diagnosis not present

## 2015-05-22 DIAGNOSIS — M25559 Pain in unspecified hip: Secondary | ICD-10-CM | POA: Diagnosis not present

## 2015-05-23 DIAGNOSIS — S76191D Other specified injury of right quadriceps muscle, fascia and tendon, subsequent encounter: Secondary | ICD-10-CM | POA: Diagnosis not present

## 2015-05-23 DIAGNOSIS — M6281 Muscle weakness (generalized): Secondary | ICD-10-CM | POA: Diagnosis not present

## 2015-05-23 DIAGNOSIS — I10 Essential (primary) hypertension: Secondary | ICD-10-CM | POA: Diagnosis not present

## 2015-05-23 DIAGNOSIS — R260 Ataxic gait: Secondary | ICD-10-CM | POA: Diagnosis not present

## 2015-05-23 DIAGNOSIS — M1990 Unspecified osteoarthritis, unspecified site: Secondary | ICD-10-CM | POA: Diagnosis not present

## 2015-05-23 DIAGNOSIS — D510 Vitamin B12 deficiency anemia due to intrinsic factor deficiency: Secondary | ICD-10-CM | POA: Diagnosis not present

## 2015-05-24 DIAGNOSIS — S76191D Other specified injury of right quadriceps muscle, fascia and tendon, subsequent encounter: Secondary | ICD-10-CM | POA: Diagnosis not present

## 2015-05-24 DIAGNOSIS — R260 Ataxic gait: Secondary | ICD-10-CM | POA: Diagnosis not present

## 2015-05-24 DIAGNOSIS — I10 Essential (primary) hypertension: Secondary | ICD-10-CM | POA: Diagnosis not present

## 2015-05-24 DIAGNOSIS — M1990 Unspecified osteoarthritis, unspecified site: Secondary | ICD-10-CM | POA: Diagnosis not present

## 2015-05-24 DIAGNOSIS — M6281 Muscle weakness (generalized): Secondary | ICD-10-CM | POA: Diagnosis not present

## 2015-05-24 DIAGNOSIS — D510 Vitamin B12 deficiency anemia due to intrinsic factor deficiency: Secondary | ICD-10-CM | POA: Diagnosis not present

## 2015-05-27 DIAGNOSIS — I10 Essential (primary) hypertension: Secondary | ICD-10-CM | POA: Diagnosis not present

## 2015-05-27 DIAGNOSIS — M6281 Muscle weakness (generalized): Secondary | ICD-10-CM | POA: Diagnosis not present

## 2015-05-27 DIAGNOSIS — R260 Ataxic gait: Secondary | ICD-10-CM | POA: Diagnosis not present

## 2015-05-27 DIAGNOSIS — S76191D Other specified injury of right quadriceps muscle, fascia and tendon, subsequent encounter: Secondary | ICD-10-CM | POA: Diagnosis not present

## 2015-05-27 DIAGNOSIS — D510 Vitamin B12 deficiency anemia due to intrinsic factor deficiency: Secondary | ICD-10-CM | POA: Diagnosis not present

## 2015-05-27 DIAGNOSIS — M1990 Unspecified osteoarthritis, unspecified site: Secondary | ICD-10-CM | POA: Diagnosis not present

## 2015-05-29 DIAGNOSIS — I10 Essential (primary) hypertension: Secondary | ICD-10-CM | POA: Diagnosis not present

## 2015-05-29 DIAGNOSIS — D510 Vitamin B12 deficiency anemia due to intrinsic factor deficiency: Secondary | ICD-10-CM | POA: Diagnosis not present

## 2015-05-29 DIAGNOSIS — M1990 Unspecified osteoarthritis, unspecified site: Secondary | ICD-10-CM | POA: Diagnosis not present

## 2015-05-29 DIAGNOSIS — M6281 Muscle weakness (generalized): Secondary | ICD-10-CM | POA: Diagnosis not present

## 2015-05-29 DIAGNOSIS — M25559 Pain in unspecified hip: Secondary | ICD-10-CM | POA: Diagnosis not present

## 2015-05-29 DIAGNOSIS — R260 Ataxic gait: Secondary | ICD-10-CM | POA: Diagnosis not present

## 2015-05-29 DIAGNOSIS — S76191D Other specified injury of right quadriceps muscle, fascia and tendon, subsequent encounter: Secondary | ICD-10-CM | POA: Diagnosis not present

## 2015-05-31 DIAGNOSIS — I10 Essential (primary) hypertension: Secondary | ICD-10-CM | POA: Diagnosis not present

## 2015-05-31 DIAGNOSIS — R260 Ataxic gait: Secondary | ICD-10-CM | POA: Diagnosis not present

## 2015-05-31 DIAGNOSIS — M6281 Muscle weakness (generalized): Secondary | ICD-10-CM | POA: Diagnosis not present

## 2015-05-31 DIAGNOSIS — S76191D Other specified injury of right quadriceps muscle, fascia and tendon, subsequent encounter: Secondary | ICD-10-CM | POA: Diagnosis not present

## 2015-05-31 DIAGNOSIS — D510 Vitamin B12 deficiency anemia due to intrinsic factor deficiency: Secondary | ICD-10-CM | POA: Diagnosis not present

## 2015-05-31 DIAGNOSIS — M1990 Unspecified osteoarthritis, unspecified site: Secondary | ICD-10-CM | POA: Diagnosis not present

## 2015-06-03 DIAGNOSIS — D510 Vitamin B12 deficiency anemia due to intrinsic factor deficiency: Secondary | ICD-10-CM | POA: Diagnosis not present

## 2015-06-03 DIAGNOSIS — G40019 Localization-related (focal) (partial) idiopathic epilepsy and epileptic syndromes with seizures of localized onset, intractable, without status epilepticus: Secondary | ICD-10-CM | POA: Diagnosis not present

## 2015-06-03 DIAGNOSIS — F0150 Vascular dementia without behavioral disturbance: Secondary | ICD-10-CM | POA: Diagnosis not present

## 2015-06-03 DIAGNOSIS — M1990 Unspecified osteoarthritis, unspecified site: Secondary | ICD-10-CM | POA: Diagnosis not present

## 2015-06-03 DIAGNOSIS — I10 Essential (primary) hypertension: Secondary | ICD-10-CM | POA: Diagnosis not present

## 2015-06-03 DIAGNOSIS — M6281 Muscle weakness (generalized): Secondary | ICD-10-CM | POA: Diagnosis not present

## 2015-06-03 DIAGNOSIS — R260 Ataxic gait: Secondary | ICD-10-CM | POA: Diagnosis not present

## 2015-06-03 DIAGNOSIS — S76191D Other specified injury of right quadriceps muscle, fascia and tendon, subsequent encounter: Secondary | ICD-10-CM | POA: Diagnosis not present

## 2015-06-03 DIAGNOSIS — F411 Generalized anxiety disorder: Secondary | ICD-10-CM | POA: Diagnosis not present

## 2015-06-04 DIAGNOSIS — R260 Ataxic gait: Secondary | ICD-10-CM | POA: Diagnosis not present

## 2015-06-04 DIAGNOSIS — M1990 Unspecified osteoarthritis, unspecified site: Secondary | ICD-10-CM | POA: Diagnosis not present

## 2015-06-04 DIAGNOSIS — D510 Vitamin B12 deficiency anemia due to intrinsic factor deficiency: Secondary | ICD-10-CM | POA: Diagnosis not present

## 2015-06-04 DIAGNOSIS — M6281 Muscle weakness (generalized): Secondary | ICD-10-CM | POA: Diagnosis not present

## 2015-06-04 DIAGNOSIS — S76191D Other specified injury of right quadriceps muscle, fascia and tendon, subsequent encounter: Secondary | ICD-10-CM | POA: Diagnosis not present

## 2015-06-04 DIAGNOSIS — I10 Essential (primary) hypertension: Secondary | ICD-10-CM | POA: Diagnosis not present

## 2015-06-05 DIAGNOSIS — D510 Vitamin B12 deficiency anemia due to intrinsic factor deficiency: Secondary | ICD-10-CM | POA: Diagnosis not present

## 2015-06-05 DIAGNOSIS — R260 Ataxic gait: Secondary | ICD-10-CM | POA: Diagnosis not present

## 2015-06-05 DIAGNOSIS — M6281 Muscle weakness (generalized): Secondary | ICD-10-CM | POA: Diagnosis not present

## 2015-06-05 DIAGNOSIS — M1990 Unspecified osteoarthritis, unspecified site: Secondary | ICD-10-CM | POA: Diagnosis not present

## 2015-06-05 DIAGNOSIS — I10 Essential (primary) hypertension: Secondary | ICD-10-CM | POA: Diagnosis not present

## 2015-06-05 DIAGNOSIS — S76191D Other specified injury of right quadriceps muscle, fascia and tendon, subsequent encounter: Secondary | ICD-10-CM | POA: Diagnosis not present

## 2015-06-07 DIAGNOSIS — M1990 Unspecified osteoarthritis, unspecified site: Secondary | ICD-10-CM | POA: Diagnosis not present

## 2015-06-07 DIAGNOSIS — D510 Vitamin B12 deficiency anemia due to intrinsic factor deficiency: Secondary | ICD-10-CM | POA: Diagnosis not present

## 2015-06-07 DIAGNOSIS — R260 Ataxic gait: Secondary | ICD-10-CM | POA: Diagnosis not present

## 2015-06-07 DIAGNOSIS — I10 Essential (primary) hypertension: Secondary | ICD-10-CM | POA: Diagnosis not present

## 2015-06-07 DIAGNOSIS — M6281 Muscle weakness (generalized): Secondary | ICD-10-CM | POA: Diagnosis not present

## 2015-06-07 DIAGNOSIS — S76191D Other specified injury of right quadriceps muscle, fascia and tendon, subsequent encounter: Secondary | ICD-10-CM | POA: Diagnosis not present

## 2015-06-11 DIAGNOSIS — I10 Essential (primary) hypertension: Secondary | ICD-10-CM | POA: Diagnosis not present

## 2015-06-11 DIAGNOSIS — M6281 Muscle weakness (generalized): Secondary | ICD-10-CM | POA: Diagnosis not present

## 2015-06-11 DIAGNOSIS — D510 Vitamin B12 deficiency anemia due to intrinsic factor deficiency: Secondary | ICD-10-CM | POA: Diagnosis not present

## 2015-06-11 DIAGNOSIS — R260 Ataxic gait: Secondary | ICD-10-CM | POA: Diagnosis not present

## 2015-06-11 DIAGNOSIS — M1990 Unspecified osteoarthritis, unspecified site: Secondary | ICD-10-CM | POA: Diagnosis not present

## 2015-06-11 DIAGNOSIS — S76191D Other specified injury of right quadriceps muscle, fascia and tendon, subsequent encounter: Secondary | ICD-10-CM | POA: Diagnosis not present

## 2015-06-12 DIAGNOSIS — M6281 Muscle weakness (generalized): Secondary | ICD-10-CM | POA: Diagnosis not present

## 2015-06-12 DIAGNOSIS — M1990 Unspecified osteoarthritis, unspecified site: Secondary | ICD-10-CM | POA: Diagnosis not present

## 2015-06-12 DIAGNOSIS — D510 Vitamin B12 deficiency anemia due to intrinsic factor deficiency: Secondary | ICD-10-CM | POA: Diagnosis not present

## 2015-06-12 DIAGNOSIS — I10 Essential (primary) hypertension: Secondary | ICD-10-CM | POA: Diagnosis not present

## 2015-06-12 DIAGNOSIS — R260 Ataxic gait: Secondary | ICD-10-CM | POA: Diagnosis not present

## 2015-06-12 DIAGNOSIS — S76191D Other specified injury of right quadriceps muscle, fascia and tendon, subsequent encounter: Secondary | ICD-10-CM | POA: Diagnosis not present

## 2015-06-14 DIAGNOSIS — M6281 Muscle weakness (generalized): Secondary | ICD-10-CM | POA: Diagnosis not present

## 2015-06-14 DIAGNOSIS — D510 Vitamin B12 deficiency anemia due to intrinsic factor deficiency: Secondary | ICD-10-CM | POA: Diagnosis not present

## 2015-06-14 DIAGNOSIS — R260 Ataxic gait: Secondary | ICD-10-CM | POA: Diagnosis not present

## 2015-06-14 DIAGNOSIS — M1990 Unspecified osteoarthritis, unspecified site: Secondary | ICD-10-CM | POA: Diagnosis not present

## 2015-06-14 DIAGNOSIS — I10 Essential (primary) hypertension: Secondary | ICD-10-CM | POA: Diagnosis not present

## 2015-06-14 DIAGNOSIS — S76191D Other specified injury of right quadriceps muscle, fascia and tendon, subsequent encounter: Secondary | ICD-10-CM | POA: Diagnosis not present

## 2015-06-17 DIAGNOSIS — D510 Vitamin B12 deficiency anemia due to intrinsic factor deficiency: Secondary | ICD-10-CM | POA: Diagnosis not present

## 2015-06-17 DIAGNOSIS — M1990 Unspecified osteoarthritis, unspecified site: Secondary | ICD-10-CM | POA: Diagnosis not present

## 2015-06-17 DIAGNOSIS — S76191D Other specified injury of right quadriceps muscle, fascia and tendon, subsequent encounter: Secondary | ICD-10-CM | POA: Diagnosis not present

## 2015-06-17 DIAGNOSIS — I10 Essential (primary) hypertension: Secondary | ICD-10-CM | POA: Diagnosis not present

## 2015-06-17 DIAGNOSIS — M6281 Muscle weakness (generalized): Secondary | ICD-10-CM | POA: Diagnosis not present

## 2015-06-17 DIAGNOSIS — R260 Ataxic gait: Secondary | ICD-10-CM | POA: Diagnosis not present

## 2015-06-18 DIAGNOSIS — M179 Osteoarthritis of knee, unspecified: Secondary | ICD-10-CM | POA: Diagnosis not present

## 2015-06-18 DIAGNOSIS — I10 Essential (primary) hypertension: Secondary | ICD-10-CM | POA: Diagnosis not present

## 2015-06-18 DIAGNOSIS — M810 Age-related osteoporosis without current pathological fracture: Secondary | ICD-10-CM | POA: Diagnosis not present

## 2015-06-18 DIAGNOSIS — M25561 Pain in right knee: Secondary | ICD-10-CM | POA: Diagnosis not present

## 2015-06-19 DIAGNOSIS — S76191D Other specified injury of right quadriceps muscle, fascia and tendon, subsequent encounter: Secondary | ICD-10-CM | POA: Diagnosis not present

## 2015-06-19 DIAGNOSIS — I10 Essential (primary) hypertension: Secondary | ICD-10-CM | POA: Diagnosis not present

## 2015-06-19 DIAGNOSIS — M6281 Muscle weakness (generalized): Secondary | ICD-10-CM | POA: Diagnosis not present

## 2015-06-19 DIAGNOSIS — D510 Vitamin B12 deficiency anemia due to intrinsic factor deficiency: Secondary | ICD-10-CM | POA: Diagnosis not present

## 2015-06-19 DIAGNOSIS — R260 Ataxic gait: Secondary | ICD-10-CM | POA: Diagnosis not present

## 2015-06-19 DIAGNOSIS — M1990 Unspecified osteoarthritis, unspecified site: Secondary | ICD-10-CM | POA: Diagnosis not present

## 2015-06-21 DIAGNOSIS — S76191D Other specified injury of right quadriceps muscle, fascia and tendon, subsequent encounter: Secondary | ICD-10-CM | POA: Diagnosis not present

## 2015-06-21 DIAGNOSIS — R260 Ataxic gait: Secondary | ICD-10-CM | POA: Diagnosis not present

## 2015-06-21 DIAGNOSIS — D510 Vitamin B12 deficiency anemia due to intrinsic factor deficiency: Secondary | ICD-10-CM | POA: Diagnosis not present

## 2015-06-21 DIAGNOSIS — M6281 Muscle weakness (generalized): Secondary | ICD-10-CM | POA: Diagnosis not present

## 2015-06-21 DIAGNOSIS — M1990 Unspecified osteoarthritis, unspecified site: Secondary | ICD-10-CM | POA: Diagnosis not present

## 2015-06-21 DIAGNOSIS — I10 Essential (primary) hypertension: Secondary | ICD-10-CM | POA: Diagnosis not present

## 2015-06-25 DIAGNOSIS — D510 Vitamin B12 deficiency anemia due to intrinsic factor deficiency: Secondary | ICD-10-CM | POA: Diagnosis not present

## 2015-06-25 DIAGNOSIS — R260 Ataxic gait: Secondary | ICD-10-CM | POA: Diagnosis not present

## 2015-06-25 DIAGNOSIS — I10 Essential (primary) hypertension: Secondary | ICD-10-CM | POA: Diagnosis not present

## 2015-06-25 DIAGNOSIS — M1990 Unspecified osteoarthritis, unspecified site: Secondary | ICD-10-CM | POA: Diagnosis not present

## 2015-06-25 DIAGNOSIS — S76191D Other specified injury of right quadriceps muscle, fascia and tendon, subsequent encounter: Secondary | ICD-10-CM | POA: Diagnosis not present

## 2015-06-25 DIAGNOSIS — M6281 Muscle weakness (generalized): Secondary | ICD-10-CM | POA: Diagnosis not present

## 2015-06-26 DIAGNOSIS — Z79899 Other long term (current) drug therapy: Secondary | ICD-10-CM | POA: Diagnosis not present

## 2015-06-26 DIAGNOSIS — D510 Vitamin B12 deficiency anemia due to intrinsic factor deficiency: Secondary | ICD-10-CM | POA: Diagnosis not present

## 2015-06-26 DIAGNOSIS — Z96651 Presence of right artificial knee joint: Secondary | ICD-10-CM | POA: Diagnosis not present

## 2015-06-26 DIAGNOSIS — M1990 Unspecified osteoarthritis, unspecified site: Secondary | ICD-10-CM | POA: Diagnosis not present

## 2015-06-26 DIAGNOSIS — G40909 Epilepsy, unspecified, not intractable, without status epilepticus: Secondary | ICD-10-CM | POA: Diagnosis not present

## 2015-06-26 DIAGNOSIS — Z9181 History of falling: Secondary | ICD-10-CM | POA: Diagnosis not present

## 2015-06-26 DIAGNOSIS — I10 Essential (primary) hypertension: Secondary | ICD-10-CM | POA: Diagnosis not present

## 2015-06-26 DIAGNOSIS — S76191S Other specified injury of right quadriceps muscle, fascia and tendon, sequela: Secondary | ICD-10-CM | POA: Diagnosis not present

## 2015-06-26 DIAGNOSIS — M2351 Chronic instability of knee, right knee: Secondary | ICD-10-CM | POA: Diagnosis not present

## 2015-06-28 DIAGNOSIS — G40909 Epilepsy, unspecified, not intractable, without status epilepticus: Secondary | ICD-10-CM | POA: Diagnosis not present

## 2015-06-28 DIAGNOSIS — M1990 Unspecified osteoarthritis, unspecified site: Secondary | ICD-10-CM | POA: Diagnosis not present

## 2015-06-28 DIAGNOSIS — I10 Essential (primary) hypertension: Secondary | ICD-10-CM | POA: Diagnosis not present

## 2015-06-28 DIAGNOSIS — M2351 Chronic instability of knee, right knee: Secondary | ICD-10-CM | POA: Diagnosis not present

## 2015-06-28 DIAGNOSIS — D510 Vitamin B12 deficiency anemia due to intrinsic factor deficiency: Secondary | ICD-10-CM | POA: Diagnosis not present

## 2015-06-28 DIAGNOSIS — S76191S Other specified injury of right quadriceps muscle, fascia and tendon, sequela: Secondary | ICD-10-CM | POA: Diagnosis not present

## 2015-07-01 DIAGNOSIS — S76191S Other specified injury of right quadriceps muscle, fascia and tendon, sequela: Secondary | ICD-10-CM | POA: Diagnosis not present

## 2015-07-01 DIAGNOSIS — D510 Vitamin B12 deficiency anemia due to intrinsic factor deficiency: Secondary | ICD-10-CM | POA: Diagnosis not present

## 2015-07-01 DIAGNOSIS — M2351 Chronic instability of knee, right knee: Secondary | ICD-10-CM | POA: Diagnosis not present

## 2015-07-01 DIAGNOSIS — I10 Essential (primary) hypertension: Secondary | ICD-10-CM | POA: Diagnosis not present

## 2015-07-01 DIAGNOSIS — G40909 Epilepsy, unspecified, not intractable, without status epilepticus: Secondary | ICD-10-CM | POA: Diagnosis not present

## 2015-07-01 DIAGNOSIS — M1990 Unspecified osteoarthritis, unspecified site: Secondary | ICD-10-CM | POA: Diagnosis not present

## 2015-07-03 DIAGNOSIS — M2351 Chronic instability of knee, right knee: Secondary | ICD-10-CM | POA: Diagnosis not present

## 2015-07-03 DIAGNOSIS — I10 Essential (primary) hypertension: Secondary | ICD-10-CM | POA: Diagnosis not present

## 2015-07-03 DIAGNOSIS — D649 Anemia, unspecified: Secondary | ICD-10-CM | POA: Diagnosis not present

## 2015-07-03 DIAGNOSIS — M1990 Unspecified osteoarthritis, unspecified site: Secondary | ICD-10-CM | POA: Diagnosis not present

## 2015-07-03 DIAGNOSIS — Z79899 Other long term (current) drug therapy: Secondary | ICD-10-CM | POA: Diagnosis not present

## 2015-07-03 DIAGNOSIS — D510 Vitamin B12 deficiency anemia due to intrinsic factor deficiency: Secondary | ICD-10-CM | POA: Diagnosis not present

## 2015-07-03 DIAGNOSIS — S76191S Other specified injury of right quadriceps muscle, fascia and tendon, sequela: Secondary | ICD-10-CM | POA: Diagnosis not present

## 2015-07-03 DIAGNOSIS — G40909 Epilepsy, unspecified, not intractable, without status epilepticus: Secondary | ICD-10-CM | POA: Diagnosis not present

## 2015-07-05 DIAGNOSIS — F411 Generalized anxiety disorder: Secondary | ICD-10-CM | POA: Diagnosis not present

## 2015-07-05 DIAGNOSIS — F0150 Vascular dementia without behavioral disturbance: Secondary | ICD-10-CM | POA: Diagnosis not present

## 2015-07-05 DIAGNOSIS — G40019 Localization-related (focal) (partial) idiopathic epilepsy and epileptic syndromes with seizures of localized onset, intractable, without status epilepticus: Secondary | ICD-10-CM | POA: Diagnosis not present

## 2015-07-08 DIAGNOSIS — M1990 Unspecified osteoarthritis, unspecified site: Secondary | ICD-10-CM | POA: Diagnosis not present

## 2015-07-08 DIAGNOSIS — S76191S Other specified injury of right quadriceps muscle, fascia and tendon, sequela: Secondary | ICD-10-CM | POA: Diagnosis not present

## 2015-07-08 DIAGNOSIS — I10 Essential (primary) hypertension: Secondary | ICD-10-CM | POA: Diagnosis not present

## 2015-07-08 DIAGNOSIS — G40909 Epilepsy, unspecified, not intractable, without status epilepticus: Secondary | ICD-10-CM | POA: Diagnosis not present

## 2015-07-08 DIAGNOSIS — D510 Vitamin B12 deficiency anemia due to intrinsic factor deficiency: Secondary | ICD-10-CM | POA: Diagnosis not present

## 2015-07-08 DIAGNOSIS — M2351 Chronic instability of knee, right knee: Secondary | ICD-10-CM | POA: Diagnosis not present

## 2015-07-09 DIAGNOSIS — M2351 Chronic instability of knee, right knee: Secondary | ICD-10-CM | POA: Diagnosis not present

## 2015-07-09 DIAGNOSIS — M1990 Unspecified osteoarthritis, unspecified site: Secondary | ICD-10-CM | POA: Diagnosis not present

## 2015-07-09 DIAGNOSIS — S76191S Other specified injury of right quadriceps muscle, fascia and tendon, sequela: Secondary | ICD-10-CM | POA: Diagnosis not present

## 2015-07-09 DIAGNOSIS — D510 Vitamin B12 deficiency anemia due to intrinsic factor deficiency: Secondary | ICD-10-CM | POA: Diagnosis not present

## 2015-07-11 DIAGNOSIS — S76191S Other specified injury of right quadriceps muscle, fascia and tendon, sequela: Secondary | ICD-10-CM | POA: Diagnosis not present

## 2015-07-11 DIAGNOSIS — M2351 Chronic instability of knee, right knee: Secondary | ICD-10-CM | POA: Diagnosis not present

## 2015-07-11 DIAGNOSIS — M1990 Unspecified osteoarthritis, unspecified site: Secondary | ICD-10-CM | POA: Diagnosis not present

## 2015-07-11 DIAGNOSIS — D510 Vitamin B12 deficiency anemia due to intrinsic factor deficiency: Secondary | ICD-10-CM | POA: Diagnosis not present

## 2015-07-11 DIAGNOSIS — I10 Essential (primary) hypertension: Secondary | ICD-10-CM | POA: Diagnosis not present

## 2015-07-11 DIAGNOSIS — G40909 Epilepsy, unspecified, not intractable, without status epilepticus: Secondary | ICD-10-CM | POA: Diagnosis not present

## 2015-07-15 DIAGNOSIS — M1990 Unspecified osteoarthritis, unspecified site: Secondary | ICD-10-CM | POA: Diagnosis not present

## 2015-07-15 DIAGNOSIS — S76191S Other specified injury of right quadriceps muscle, fascia and tendon, sequela: Secondary | ICD-10-CM | POA: Diagnosis not present

## 2015-07-15 DIAGNOSIS — I10 Essential (primary) hypertension: Secondary | ICD-10-CM | POA: Diagnosis not present

## 2015-07-15 DIAGNOSIS — M2351 Chronic instability of knee, right knee: Secondary | ICD-10-CM | POA: Diagnosis not present

## 2015-07-15 DIAGNOSIS — D510 Vitamin B12 deficiency anemia due to intrinsic factor deficiency: Secondary | ICD-10-CM | POA: Diagnosis not present

## 2015-07-15 DIAGNOSIS — G40909 Epilepsy, unspecified, not intractable, without status epilepticus: Secondary | ICD-10-CM | POA: Diagnosis not present

## 2015-07-17 DIAGNOSIS — S76191S Other specified injury of right quadriceps muscle, fascia and tendon, sequela: Secondary | ICD-10-CM | POA: Diagnosis not present

## 2015-07-17 DIAGNOSIS — D510 Vitamin B12 deficiency anemia due to intrinsic factor deficiency: Secondary | ICD-10-CM | POA: Diagnosis not present

## 2015-07-17 DIAGNOSIS — G40909 Epilepsy, unspecified, not intractable, without status epilepticus: Secondary | ICD-10-CM | POA: Diagnosis not present

## 2015-07-17 DIAGNOSIS — M2351 Chronic instability of knee, right knee: Secondary | ICD-10-CM | POA: Diagnosis not present

## 2015-07-17 DIAGNOSIS — M1990 Unspecified osteoarthritis, unspecified site: Secondary | ICD-10-CM | POA: Diagnosis not present

## 2015-07-17 DIAGNOSIS — I10 Essential (primary) hypertension: Secondary | ICD-10-CM | POA: Diagnosis not present

## 2015-07-23 DIAGNOSIS — M2351 Chronic instability of knee, right knee: Secondary | ICD-10-CM | POA: Diagnosis not present

## 2015-07-23 DIAGNOSIS — G40909 Epilepsy, unspecified, not intractable, without status epilepticus: Secondary | ICD-10-CM | POA: Diagnosis not present

## 2015-07-23 DIAGNOSIS — D510 Vitamin B12 deficiency anemia due to intrinsic factor deficiency: Secondary | ICD-10-CM | POA: Diagnosis not present

## 2015-07-23 DIAGNOSIS — S76191S Other specified injury of right quadriceps muscle, fascia and tendon, sequela: Secondary | ICD-10-CM | POA: Diagnosis not present

## 2015-07-23 DIAGNOSIS — I10 Essential (primary) hypertension: Secondary | ICD-10-CM | POA: Diagnosis not present

## 2015-07-23 DIAGNOSIS — M1990 Unspecified osteoarthritis, unspecified site: Secondary | ICD-10-CM | POA: Diagnosis not present

## 2015-07-25 DIAGNOSIS — I10 Essential (primary) hypertension: Secondary | ICD-10-CM | POA: Diagnosis not present

## 2015-07-25 DIAGNOSIS — M2351 Chronic instability of knee, right knee: Secondary | ICD-10-CM | POA: Diagnosis not present

## 2015-07-25 DIAGNOSIS — M1990 Unspecified osteoarthritis, unspecified site: Secondary | ICD-10-CM | POA: Diagnosis not present

## 2015-07-25 DIAGNOSIS — S76191S Other specified injury of right quadriceps muscle, fascia and tendon, sequela: Secondary | ICD-10-CM | POA: Diagnosis not present

## 2015-07-25 DIAGNOSIS — G40909 Epilepsy, unspecified, not intractable, without status epilepticus: Secondary | ICD-10-CM | POA: Diagnosis not present

## 2015-07-25 DIAGNOSIS — D510 Vitamin B12 deficiency anemia due to intrinsic factor deficiency: Secondary | ICD-10-CM | POA: Diagnosis not present

## 2015-07-29 DIAGNOSIS — M1990 Unspecified osteoarthritis, unspecified site: Secondary | ICD-10-CM | POA: Diagnosis not present

## 2015-07-29 DIAGNOSIS — I10 Essential (primary) hypertension: Secondary | ICD-10-CM | POA: Diagnosis not present

## 2015-07-29 DIAGNOSIS — G40909 Epilepsy, unspecified, not intractable, without status epilepticus: Secondary | ICD-10-CM | POA: Diagnosis not present

## 2015-07-29 DIAGNOSIS — M2351 Chronic instability of knee, right knee: Secondary | ICD-10-CM | POA: Diagnosis not present

## 2015-07-29 DIAGNOSIS — S76191S Other specified injury of right quadriceps muscle, fascia and tendon, sequela: Secondary | ICD-10-CM | POA: Diagnosis not present

## 2015-07-29 DIAGNOSIS — D510 Vitamin B12 deficiency anemia due to intrinsic factor deficiency: Secondary | ICD-10-CM | POA: Diagnosis not present

## 2015-07-30 DIAGNOSIS — M25561 Pain in right knee: Secondary | ICD-10-CM | POA: Diagnosis not present

## 2015-07-30 DIAGNOSIS — M170 Bilateral primary osteoarthritis of knee: Secondary | ICD-10-CM | POA: Diagnosis not present

## 2015-07-30 DIAGNOSIS — M2351 Chronic instability of knee, right knee: Secondary | ICD-10-CM | POA: Diagnosis not present

## 2015-07-31 DIAGNOSIS — S76191S Other specified injury of right quadriceps muscle, fascia and tendon, sequela: Secondary | ICD-10-CM | POA: Diagnosis not present

## 2015-07-31 DIAGNOSIS — M2351 Chronic instability of knee, right knee: Secondary | ICD-10-CM | POA: Diagnosis not present

## 2015-07-31 DIAGNOSIS — G40909 Epilepsy, unspecified, not intractable, without status epilepticus: Secondary | ICD-10-CM | POA: Diagnosis not present

## 2015-07-31 DIAGNOSIS — D510 Vitamin B12 deficiency anemia due to intrinsic factor deficiency: Secondary | ICD-10-CM | POA: Diagnosis not present

## 2015-07-31 DIAGNOSIS — M1990 Unspecified osteoarthritis, unspecified site: Secondary | ICD-10-CM | POA: Diagnosis not present

## 2015-07-31 DIAGNOSIS — I10 Essential (primary) hypertension: Secondary | ICD-10-CM | POA: Diagnosis not present

## 2015-08-02 DIAGNOSIS — F411 Generalized anxiety disorder: Secondary | ICD-10-CM | POA: Diagnosis not present

## 2015-08-02 DIAGNOSIS — F0150 Vascular dementia without behavioral disturbance: Secondary | ICD-10-CM | POA: Diagnosis not present

## 2015-08-02 DIAGNOSIS — G40019 Localization-related (focal) (partial) idiopathic epilepsy and epileptic syndromes with seizures of localized onset, intractable, without status epilepticus: Secondary | ICD-10-CM | POA: Diagnosis not present

## 2015-08-14 DIAGNOSIS — E785 Hyperlipidemia, unspecified: Secondary | ICD-10-CM | POA: Diagnosis not present

## 2015-08-14 DIAGNOSIS — D649 Anemia, unspecified: Secondary | ICD-10-CM | POA: Diagnosis not present

## 2015-08-14 DIAGNOSIS — E559 Vitamin D deficiency, unspecified: Secondary | ICD-10-CM | POA: Diagnosis not present

## 2015-08-14 DIAGNOSIS — Z79899 Other long term (current) drug therapy: Secondary | ICD-10-CM | POA: Diagnosis not present

## 2015-08-14 DIAGNOSIS — E039 Hypothyroidism, unspecified: Secondary | ICD-10-CM | POA: Diagnosis not present

## 2015-08-14 DIAGNOSIS — S76119A Strain of unspecified quadriceps muscle, fascia and tendon, initial encounter: Secondary | ICD-10-CM | POA: Diagnosis not present

## 2015-08-14 DIAGNOSIS — Z96651 Presence of right artificial knee joint: Secondary | ICD-10-CM | POA: Diagnosis not present

## 2015-08-20 DIAGNOSIS — F328 Other depressive episodes: Secondary | ICD-10-CM | POA: Diagnosis not present

## 2015-08-20 DIAGNOSIS — M170 Bilateral primary osteoarthritis of knee: Secondary | ICD-10-CM | POA: Diagnosis not present

## 2015-08-20 DIAGNOSIS — I10 Essential (primary) hypertension: Secondary | ICD-10-CM | POA: Diagnosis not present

## 2015-08-20 DIAGNOSIS — M810 Age-related osteoporosis without current pathological fracture: Secondary | ICD-10-CM | POA: Diagnosis not present

## 2015-08-27 DIAGNOSIS — F411 Generalized anxiety disorder: Secondary | ICD-10-CM | POA: Diagnosis not present

## 2015-08-27 DIAGNOSIS — G40019 Localization-related (focal) (partial) idiopathic epilepsy and epileptic syndromes with seizures of localized onset, intractable, without status epilepticus: Secondary | ICD-10-CM | POA: Diagnosis not present

## 2015-08-27 DIAGNOSIS — F0150 Vascular dementia without behavioral disturbance: Secondary | ICD-10-CM | POA: Diagnosis not present

## 2015-09-03 DIAGNOSIS — R0989 Other specified symptoms and signs involving the circulatory and respiratory systems: Secondary | ICD-10-CM | POA: Diagnosis not present

## 2015-09-03 DIAGNOSIS — R221 Localized swelling, mass and lump, neck: Secondary | ICD-10-CM | POA: Diagnosis not present

## 2015-09-11 DIAGNOSIS — Z79899 Other long term (current) drug therapy: Secondary | ICD-10-CM | POA: Diagnosis not present

## 2015-09-11 DIAGNOSIS — D649 Anemia, unspecified: Secondary | ICD-10-CM | POA: Diagnosis not present

## 2015-09-11 DIAGNOSIS — I10 Essential (primary) hypertension: Secondary | ICD-10-CM | POA: Diagnosis not present

## 2015-09-19 DIAGNOSIS — Z23 Encounter for immunization: Secondary | ICD-10-CM | POA: Diagnosis not present

## 2015-10-07 DIAGNOSIS — F411 Generalized anxiety disorder: Secondary | ICD-10-CM | POA: Diagnosis not present

## 2015-10-07 DIAGNOSIS — F0150 Vascular dementia without behavioral disturbance: Secondary | ICD-10-CM | POA: Diagnosis not present

## 2015-10-07 DIAGNOSIS — G40019 Localization-related (focal) (partial) idiopathic epilepsy and epileptic syndromes with seizures of localized onset, intractable, without status epilepticus: Secondary | ICD-10-CM | POA: Diagnosis not present

## 2015-10-09 DIAGNOSIS — R51 Headache: Secondary | ICD-10-CM | POA: Diagnosis not present

## 2015-10-09 DIAGNOSIS — S0003XA Contusion of scalp, initial encounter: Secondary | ICD-10-CM | POA: Diagnosis not present

## 2015-10-09 DIAGNOSIS — R42 Dizziness and giddiness: Secondary | ICD-10-CM | POA: Diagnosis not present

## 2015-10-09 DIAGNOSIS — S0990XA Unspecified injury of head, initial encounter: Secondary | ICD-10-CM | POA: Diagnosis not present

## 2015-10-09 DIAGNOSIS — D649 Anemia, unspecified: Secondary | ICD-10-CM | POA: Diagnosis not present

## 2015-10-29 DIAGNOSIS — M810 Age-related osteoporosis without current pathological fracture: Secondary | ICD-10-CM | POA: Diagnosis not present

## 2015-10-29 DIAGNOSIS — I10 Essential (primary) hypertension: Secondary | ICD-10-CM | POA: Diagnosis not present

## 2015-10-29 DIAGNOSIS — Z0000 Encounter for general adult medical examination without abnormal findings: Secondary | ICD-10-CM | POA: Diagnosis not present

## 2015-10-29 DIAGNOSIS — M170 Bilateral primary osteoarthritis of knee: Secondary | ICD-10-CM | POA: Diagnosis not present

## 2015-10-29 DIAGNOSIS — F3289 Other specified depressive episodes: Secondary | ICD-10-CM | POA: Diagnosis not present

## 2015-11-05 DIAGNOSIS — F0150 Vascular dementia without behavioral disturbance: Secondary | ICD-10-CM | POA: Diagnosis not present

## 2015-11-05 DIAGNOSIS — F411 Generalized anxiety disorder: Secondary | ICD-10-CM | POA: Diagnosis not present

## 2015-11-05 DIAGNOSIS — G40019 Localization-related (focal) (partial) idiopathic epilepsy and epileptic syndromes with seizures of localized onset, intractable, without status epilepticus: Secondary | ICD-10-CM | POA: Diagnosis not present

## 2015-12-04 DIAGNOSIS — Z79899 Other long term (current) drug therapy: Secondary | ICD-10-CM | POA: Diagnosis not present

## 2015-12-04 DIAGNOSIS — D649 Anemia, unspecified: Secondary | ICD-10-CM | POA: Diagnosis not present

## 2015-12-04 DIAGNOSIS — R569 Unspecified convulsions: Secondary | ICD-10-CM | POA: Diagnosis not present

## 2015-12-04 DIAGNOSIS — M810 Age-related osteoporosis without current pathological fracture: Secondary | ICD-10-CM | POA: Diagnosis not present

## 2015-12-04 DIAGNOSIS — Z96651 Presence of right artificial knee joint: Secondary | ICD-10-CM | POA: Diagnosis not present

## 2015-12-04 DIAGNOSIS — S76119A Strain of unspecified quadriceps muscle, fascia and tendon, initial encounter: Secondary | ICD-10-CM | POA: Diagnosis not present

## 2015-12-05 DIAGNOSIS — F0150 Vascular dementia without behavioral disturbance: Secondary | ICD-10-CM | POA: Diagnosis not present

## 2015-12-05 DIAGNOSIS — F411 Generalized anxiety disorder: Secondary | ICD-10-CM | POA: Diagnosis not present

## 2015-12-05 DIAGNOSIS — Z7982 Long term (current) use of aspirin: Secondary | ICD-10-CM | POA: Diagnosis not present

## 2015-12-05 DIAGNOSIS — M1990 Unspecified osteoarthritis, unspecified site: Secondary | ICD-10-CM | POA: Diagnosis not present

## 2015-12-05 DIAGNOSIS — G40019 Localization-related (focal) (partial) idiopathic epilepsy and epileptic syndromes with seizures of localized onset, intractable, without status epilepticus: Secondary | ICD-10-CM | POA: Diagnosis not present

## 2015-12-05 DIAGNOSIS — Z9181 History of falling: Secondary | ICD-10-CM | POA: Diagnosis not present

## 2015-12-05 DIAGNOSIS — G40901 Epilepsy, unspecified, not intractable, with status epilepticus: Secondary | ICD-10-CM | POA: Diagnosis not present

## 2015-12-05 DIAGNOSIS — S76191S Other specified injury of right quadriceps muscle, fascia and tendon, sequela: Secondary | ICD-10-CM | POA: Diagnosis not present

## 2015-12-05 DIAGNOSIS — I10 Essential (primary) hypertension: Secondary | ICD-10-CM | POA: Diagnosis not present

## 2015-12-05 DIAGNOSIS — Z96651 Presence of right artificial knee joint: Secondary | ICD-10-CM | POA: Diagnosis not present

## 2015-12-05 DIAGNOSIS — R260 Ataxic gait: Secondary | ICD-10-CM | POA: Diagnosis not present

## 2015-12-05 DIAGNOSIS — Z79899 Other long term (current) drug therapy: Secondary | ICD-10-CM | POA: Diagnosis not present

## 2015-12-10 DIAGNOSIS — D649 Anemia, unspecified: Secondary | ICD-10-CM | POA: Diagnosis not present

## 2015-12-11 DIAGNOSIS — D649 Anemia, unspecified: Secondary | ICD-10-CM | POA: Diagnosis not present

## 2015-12-12 DIAGNOSIS — S0990XA Unspecified injury of head, initial encounter: Secondary | ICD-10-CM | POA: Diagnosis not present

## 2015-12-12 DIAGNOSIS — S0003XA Contusion of scalp, initial encounter: Secondary | ICD-10-CM | POA: Diagnosis not present

## 2015-12-12 DIAGNOSIS — S76191S Other specified injury of right quadriceps muscle, fascia and tendon, sequela: Secondary | ICD-10-CM | POA: Diagnosis not present

## 2015-12-12 DIAGNOSIS — M7989 Other specified soft tissue disorders: Secondary | ICD-10-CM | POA: Diagnosis not present

## 2015-12-12 DIAGNOSIS — M1990 Unspecified osteoarthritis, unspecified site: Secondary | ICD-10-CM | POA: Diagnosis not present

## 2015-12-12 DIAGNOSIS — Z96651 Presence of right artificial knee joint: Secondary | ICD-10-CM | POA: Diagnosis not present

## 2015-12-12 DIAGNOSIS — I10 Essential (primary) hypertension: Secondary | ICD-10-CM | POA: Diagnosis not present

## 2015-12-12 DIAGNOSIS — R260 Ataxic gait: Secondary | ICD-10-CM | POA: Diagnosis not present

## 2015-12-12 DIAGNOSIS — D649 Anemia, unspecified: Secondary | ICD-10-CM | POA: Diagnosis not present

## 2015-12-12 DIAGNOSIS — M25561 Pain in right knee: Secondary | ICD-10-CM | POA: Diagnosis not present

## 2015-12-12 DIAGNOSIS — R51 Headache: Secondary | ICD-10-CM | POA: Diagnosis not present

## 2015-12-12 DIAGNOSIS — G40901 Epilepsy, unspecified, not intractable, with status epilepticus: Secondary | ICD-10-CM | POA: Diagnosis not present

## 2015-12-13 DIAGNOSIS — M1990 Unspecified osteoarthritis, unspecified site: Secondary | ICD-10-CM | POA: Diagnosis not present

## 2015-12-13 DIAGNOSIS — G40901 Epilepsy, unspecified, not intractable, with status epilepticus: Secondary | ICD-10-CM | POA: Diagnosis not present

## 2015-12-13 DIAGNOSIS — I10 Essential (primary) hypertension: Secondary | ICD-10-CM | POA: Diagnosis not present

## 2015-12-13 DIAGNOSIS — S76191S Other specified injury of right quadriceps muscle, fascia and tendon, sequela: Secondary | ICD-10-CM | POA: Diagnosis not present

## 2015-12-13 DIAGNOSIS — R260 Ataxic gait: Secondary | ICD-10-CM | POA: Diagnosis not present

## 2015-12-13 DIAGNOSIS — Z96651 Presence of right artificial knee joint: Secondary | ICD-10-CM | POA: Diagnosis not present

## 2015-12-17 DIAGNOSIS — G40901 Epilepsy, unspecified, not intractable, with status epilepticus: Secondary | ICD-10-CM | POA: Diagnosis not present

## 2015-12-17 DIAGNOSIS — Z96651 Presence of right artificial knee joint: Secondary | ICD-10-CM | POA: Diagnosis not present

## 2015-12-17 DIAGNOSIS — M1990 Unspecified osteoarthritis, unspecified site: Secondary | ICD-10-CM | POA: Diagnosis not present

## 2015-12-17 DIAGNOSIS — S76191S Other specified injury of right quadriceps muscle, fascia and tendon, sequela: Secondary | ICD-10-CM | POA: Diagnosis not present

## 2015-12-17 DIAGNOSIS — I10 Essential (primary) hypertension: Secondary | ICD-10-CM | POA: Diagnosis not present

## 2015-12-17 DIAGNOSIS — R260 Ataxic gait: Secondary | ICD-10-CM | POA: Diagnosis not present

## 2015-12-18 DIAGNOSIS — Z96651 Presence of right artificial knee joint: Secondary | ICD-10-CM | POA: Diagnosis not present

## 2015-12-18 DIAGNOSIS — S76191S Other specified injury of right quadriceps muscle, fascia and tendon, sequela: Secondary | ICD-10-CM | POA: Diagnosis not present

## 2015-12-18 DIAGNOSIS — I10 Essential (primary) hypertension: Secondary | ICD-10-CM | POA: Diagnosis not present

## 2015-12-18 DIAGNOSIS — G40901 Epilepsy, unspecified, not intractable, with status epilepticus: Secondary | ICD-10-CM | POA: Diagnosis not present

## 2015-12-18 DIAGNOSIS — R260 Ataxic gait: Secondary | ICD-10-CM | POA: Diagnosis not present

## 2015-12-18 DIAGNOSIS — M1990 Unspecified osteoarthritis, unspecified site: Secondary | ICD-10-CM | POA: Diagnosis not present

## 2015-12-24 DIAGNOSIS — R260 Ataxic gait: Secondary | ICD-10-CM | POA: Diagnosis not present

## 2015-12-24 DIAGNOSIS — S76191S Other specified injury of right quadriceps muscle, fascia and tendon, sequela: Secondary | ICD-10-CM | POA: Diagnosis not present

## 2015-12-24 DIAGNOSIS — I10 Essential (primary) hypertension: Secondary | ICD-10-CM | POA: Diagnosis not present

## 2015-12-24 DIAGNOSIS — G40901 Epilepsy, unspecified, not intractable, with status epilepticus: Secondary | ICD-10-CM | POA: Diagnosis not present

## 2015-12-24 DIAGNOSIS — Z96651 Presence of right artificial knee joint: Secondary | ICD-10-CM | POA: Diagnosis not present

## 2015-12-24 DIAGNOSIS — M1990 Unspecified osteoarthritis, unspecified site: Secondary | ICD-10-CM | POA: Diagnosis not present

## 2015-12-26 DIAGNOSIS — G40901 Epilepsy, unspecified, not intractable, with status epilepticus: Secondary | ICD-10-CM | POA: Diagnosis not present

## 2015-12-26 DIAGNOSIS — Z96651 Presence of right artificial knee joint: Secondary | ICD-10-CM | POA: Diagnosis not present

## 2015-12-26 DIAGNOSIS — I10 Essential (primary) hypertension: Secondary | ICD-10-CM | POA: Diagnosis not present

## 2015-12-26 DIAGNOSIS — R260 Ataxic gait: Secondary | ICD-10-CM | POA: Diagnosis not present

## 2015-12-26 DIAGNOSIS — M1990 Unspecified osteoarthritis, unspecified site: Secondary | ICD-10-CM | POA: Diagnosis not present

## 2015-12-26 DIAGNOSIS — S76191S Other specified injury of right quadriceps muscle, fascia and tendon, sequela: Secondary | ICD-10-CM | POA: Diagnosis not present

## 2015-12-30 DIAGNOSIS — S76191S Other specified injury of right quadriceps muscle, fascia and tendon, sequela: Secondary | ICD-10-CM | POA: Diagnosis not present

## 2015-12-30 DIAGNOSIS — Z96651 Presence of right artificial knee joint: Secondary | ICD-10-CM | POA: Diagnosis not present

## 2015-12-30 DIAGNOSIS — R260 Ataxic gait: Secondary | ICD-10-CM | POA: Diagnosis not present

## 2015-12-30 DIAGNOSIS — M1990 Unspecified osteoarthritis, unspecified site: Secondary | ICD-10-CM | POA: Diagnosis not present

## 2015-12-30 DIAGNOSIS — G40901 Epilepsy, unspecified, not intractable, with status epilepticus: Secondary | ICD-10-CM | POA: Diagnosis not present

## 2015-12-30 DIAGNOSIS — I10 Essential (primary) hypertension: Secondary | ICD-10-CM | POA: Diagnosis not present

## 2016-01-02 DIAGNOSIS — G40019 Localization-related (focal) (partial) idiopathic epilepsy and epileptic syndromes with seizures of localized onset, intractable, without status epilepticus: Secondary | ICD-10-CM | POA: Diagnosis not present

## 2016-01-02 DIAGNOSIS — F0150 Vascular dementia without behavioral disturbance: Secondary | ICD-10-CM | POA: Diagnosis not present

## 2016-01-02 DIAGNOSIS — F411 Generalized anxiety disorder: Secondary | ICD-10-CM | POA: Diagnosis not present

## 2016-01-03 DIAGNOSIS — G40901 Epilepsy, unspecified, not intractable, with status epilepticus: Secondary | ICD-10-CM | POA: Diagnosis not present

## 2016-01-03 DIAGNOSIS — M1990 Unspecified osteoarthritis, unspecified site: Secondary | ICD-10-CM | POA: Diagnosis not present

## 2016-01-03 DIAGNOSIS — S76191S Other specified injury of right quadriceps muscle, fascia and tendon, sequela: Secondary | ICD-10-CM | POA: Diagnosis not present

## 2016-01-03 DIAGNOSIS — R260 Ataxic gait: Secondary | ICD-10-CM | POA: Diagnosis not present

## 2016-01-03 DIAGNOSIS — Z96651 Presence of right artificial knee joint: Secondary | ICD-10-CM | POA: Diagnosis not present

## 2016-01-03 DIAGNOSIS — I10 Essential (primary) hypertension: Secondary | ICD-10-CM | POA: Diagnosis not present

## 2016-01-06 DIAGNOSIS — M1990 Unspecified osteoarthritis, unspecified site: Secondary | ICD-10-CM | POA: Diagnosis not present

## 2016-01-06 DIAGNOSIS — Z96651 Presence of right artificial knee joint: Secondary | ICD-10-CM | POA: Diagnosis not present

## 2016-01-06 DIAGNOSIS — I10 Essential (primary) hypertension: Secondary | ICD-10-CM | POA: Diagnosis not present

## 2016-01-06 DIAGNOSIS — G40901 Epilepsy, unspecified, not intractable, with status epilepticus: Secondary | ICD-10-CM | POA: Diagnosis not present

## 2016-01-06 DIAGNOSIS — S76191S Other specified injury of right quadriceps muscle, fascia and tendon, sequela: Secondary | ICD-10-CM | POA: Diagnosis not present

## 2016-01-06 DIAGNOSIS — R260 Ataxic gait: Secondary | ICD-10-CM | POA: Diagnosis not present

## 2016-01-13 DIAGNOSIS — S76191S Other specified injury of right quadriceps muscle, fascia and tendon, sequela: Secondary | ICD-10-CM | POA: Diagnosis not present

## 2016-01-13 DIAGNOSIS — G40901 Epilepsy, unspecified, not intractable, with status epilepticus: Secondary | ICD-10-CM | POA: Diagnosis not present

## 2016-01-13 DIAGNOSIS — Z96651 Presence of right artificial knee joint: Secondary | ICD-10-CM | POA: Diagnosis not present

## 2016-01-13 DIAGNOSIS — I10 Essential (primary) hypertension: Secondary | ICD-10-CM | POA: Diagnosis not present

## 2016-01-13 DIAGNOSIS — M1990 Unspecified osteoarthritis, unspecified site: Secondary | ICD-10-CM | POA: Diagnosis not present

## 2016-01-13 DIAGNOSIS — R260 Ataxic gait: Secondary | ICD-10-CM | POA: Diagnosis not present

## 2016-01-17 DIAGNOSIS — M1990 Unspecified osteoarthritis, unspecified site: Secondary | ICD-10-CM | POA: Diagnosis not present

## 2016-01-17 DIAGNOSIS — I10 Essential (primary) hypertension: Secondary | ICD-10-CM | POA: Diagnosis not present

## 2016-01-17 DIAGNOSIS — Z96651 Presence of right artificial knee joint: Secondary | ICD-10-CM | POA: Diagnosis not present

## 2016-01-17 DIAGNOSIS — R260 Ataxic gait: Secondary | ICD-10-CM | POA: Diagnosis not present

## 2016-01-17 DIAGNOSIS — G40901 Epilepsy, unspecified, not intractable, with status epilepticus: Secondary | ICD-10-CM | POA: Diagnosis not present

## 2016-01-17 DIAGNOSIS — S76191S Other specified injury of right quadriceps muscle, fascia and tendon, sequela: Secondary | ICD-10-CM | POA: Diagnosis not present

## 2016-01-20 DIAGNOSIS — G40901 Epilepsy, unspecified, not intractable, with status epilepticus: Secondary | ICD-10-CM | POA: Diagnosis not present

## 2016-01-20 DIAGNOSIS — R260 Ataxic gait: Secondary | ICD-10-CM | POA: Diagnosis not present

## 2016-01-20 DIAGNOSIS — Z96651 Presence of right artificial knee joint: Secondary | ICD-10-CM | POA: Diagnosis not present

## 2016-01-20 DIAGNOSIS — I10 Essential (primary) hypertension: Secondary | ICD-10-CM | POA: Diagnosis not present

## 2016-01-20 DIAGNOSIS — M1990 Unspecified osteoarthritis, unspecified site: Secondary | ICD-10-CM | POA: Diagnosis not present

## 2016-01-20 DIAGNOSIS — S76191S Other specified injury of right quadriceps muscle, fascia and tendon, sequela: Secondary | ICD-10-CM | POA: Diagnosis not present

## 2016-01-23 DIAGNOSIS — I10 Essential (primary) hypertension: Secondary | ICD-10-CM | POA: Diagnosis not present

## 2016-01-23 DIAGNOSIS — M1990 Unspecified osteoarthritis, unspecified site: Secondary | ICD-10-CM | POA: Diagnosis not present

## 2016-01-23 DIAGNOSIS — G40901 Epilepsy, unspecified, not intractable, with status epilepticus: Secondary | ICD-10-CM | POA: Diagnosis not present

## 2016-01-23 DIAGNOSIS — R260 Ataxic gait: Secondary | ICD-10-CM | POA: Diagnosis not present

## 2016-01-23 DIAGNOSIS — S76191S Other specified injury of right quadriceps muscle, fascia and tendon, sequela: Secondary | ICD-10-CM | POA: Diagnosis not present

## 2016-01-23 DIAGNOSIS — Z96651 Presence of right artificial knee joint: Secondary | ICD-10-CM | POA: Diagnosis not present

## 2016-01-27 DIAGNOSIS — G40901 Epilepsy, unspecified, not intractable, with status epilepticus: Secondary | ICD-10-CM | POA: Diagnosis not present

## 2016-01-27 DIAGNOSIS — R260 Ataxic gait: Secondary | ICD-10-CM | POA: Diagnosis not present

## 2016-01-27 DIAGNOSIS — M1990 Unspecified osteoarthritis, unspecified site: Secondary | ICD-10-CM | POA: Diagnosis not present

## 2016-01-27 DIAGNOSIS — I10 Essential (primary) hypertension: Secondary | ICD-10-CM | POA: Diagnosis not present

## 2016-01-27 DIAGNOSIS — S76191S Other specified injury of right quadriceps muscle, fascia and tendon, sequela: Secondary | ICD-10-CM | POA: Diagnosis not present

## 2016-01-27 DIAGNOSIS — Z96651 Presence of right artificial knee joint: Secondary | ICD-10-CM | POA: Diagnosis not present

## 2016-01-30 DIAGNOSIS — G40901 Epilepsy, unspecified, not intractable, with status epilepticus: Secondary | ICD-10-CM | POA: Diagnosis not present

## 2016-01-30 DIAGNOSIS — Z96651 Presence of right artificial knee joint: Secondary | ICD-10-CM | POA: Diagnosis not present

## 2016-01-30 DIAGNOSIS — I10 Essential (primary) hypertension: Secondary | ICD-10-CM | POA: Diagnosis not present

## 2016-01-30 DIAGNOSIS — R260 Ataxic gait: Secondary | ICD-10-CM | POA: Diagnosis not present

## 2016-01-30 DIAGNOSIS — S76191S Other specified injury of right quadriceps muscle, fascia and tendon, sequela: Secondary | ICD-10-CM | POA: Diagnosis not present

## 2016-01-30 DIAGNOSIS — M1990 Unspecified osteoarthritis, unspecified site: Secondary | ICD-10-CM | POA: Diagnosis not present

## 2016-02-03 DIAGNOSIS — Z96651 Presence of right artificial knee joint: Secondary | ICD-10-CM | POA: Diagnosis not present

## 2016-02-03 DIAGNOSIS — M1990 Unspecified osteoarthritis, unspecified site: Secondary | ICD-10-CM | POA: Diagnosis not present

## 2016-02-03 DIAGNOSIS — Z7982 Long term (current) use of aspirin: Secondary | ICD-10-CM | POA: Diagnosis not present

## 2016-02-03 DIAGNOSIS — Z79899 Other long term (current) drug therapy: Secondary | ICD-10-CM | POA: Diagnosis not present

## 2016-02-03 DIAGNOSIS — R2689 Other abnormalities of gait and mobility: Secondary | ICD-10-CM | POA: Diagnosis not present

## 2016-02-03 DIAGNOSIS — Z9181 History of falling: Secondary | ICD-10-CM | POA: Diagnosis not present

## 2016-02-03 DIAGNOSIS — S76191S Other specified injury of right quadriceps muscle, fascia and tendon, sequela: Secondary | ICD-10-CM | POA: Diagnosis not present

## 2016-02-03 DIAGNOSIS — G40901 Epilepsy, unspecified, not intractable, with status epilepticus: Secondary | ICD-10-CM | POA: Diagnosis not present

## 2016-02-03 DIAGNOSIS — I10 Essential (primary) hypertension: Secondary | ICD-10-CM | POA: Diagnosis not present

## 2016-02-04 DIAGNOSIS — F411 Generalized anxiety disorder: Secondary | ICD-10-CM | POA: Diagnosis not present

## 2016-02-04 DIAGNOSIS — G40019 Localization-related (focal) (partial) idiopathic epilepsy and epileptic syndromes with seizures of localized onset, intractable, without status epilepticus: Secondary | ICD-10-CM | POA: Diagnosis not present

## 2016-02-04 DIAGNOSIS — F0150 Vascular dementia without behavioral disturbance: Secondary | ICD-10-CM | POA: Diagnosis not present

## 2016-02-05 DIAGNOSIS — D649 Anemia, unspecified: Secondary | ICD-10-CM | POA: Diagnosis not present

## 2016-02-06 DIAGNOSIS — S76191S Other specified injury of right quadriceps muscle, fascia and tendon, sequela: Secondary | ICD-10-CM | POA: Diagnosis not present

## 2016-02-06 DIAGNOSIS — G40901 Epilepsy, unspecified, not intractable, with status epilepticus: Secondary | ICD-10-CM | POA: Diagnosis not present

## 2016-02-06 DIAGNOSIS — Z96651 Presence of right artificial knee joint: Secondary | ICD-10-CM | POA: Diagnosis not present

## 2016-02-06 DIAGNOSIS — I10 Essential (primary) hypertension: Secondary | ICD-10-CM | POA: Diagnosis not present

## 2016-02-06 DIAGNOSIS — M1990 Unspecified osteoarthritis, unspecified site: Secondary | ICD-10-CM | POA: Diagnosis not present

## 2016-02-06 DIAGNOSIS — R2689 Other abnormalities of gait and mobility: Secondary | ICD-10-CM | POA: Diagnosis not present

## 2016-02-17 DIAGNOSIS — S20221A Contusion of right back wall of thorax, initial encounter: Secondary | ICD-10-CM | POA: Diagnosis not present

## 2016-02-17 DIAGNOSIS — S3992XA Unspecified injury of lower back, initial encounter: Secondary | ICD-10-CM | POA: Diagnosis not present

## 2016-02-17 DIAGNOSIS — S79911A Unspecified injury of right hip, initial encounter: Secondary | ICD-10-CM | POA: Diagnosis not present

## 2016-02-17 DIAGNOSIS — M545 Low back pain: Secondary | ICD-10-CM | POA: Diagnosis not present

## 2016-02-17 DIAGNOSIS — R52 Pain, unspecified: Secondary | ICD-10-CM | POA: Diagnosis not present

## 2016-02-19 DIAGNOSIS — S76191S Other specified injury of right quadriceps muscle, fascia and tendon, sequela: Secondary | ICD-10-CM | POA: Diagnosis not present

## 2016-02-19 DIAGNOSIS — Z96651 Presence of right artificial knee joint: Secondary | ICD-10-CM | POA: Diagnosis not present

## 2016-02-19 DIAGNOSIS — M1990 Unspecified osteoarthritis, unspecified site: Secondary | ICD-10-CM | POA: Diagnosis not present

## 2016-02-19 DIAGNOSIS — G40901 Epilepsy, unspecified, not intractable, with status epilepticus: Secondary | ICD-10-CM | POA: Diagnosis not present

## 2016-02-19 DIAGNOSIS — I10 Essential (primary) hypertension: Secondary | ICD-10-CM | POA: Diagnosis not present

## 2016-02-19 DIAGNOSIS — R2689 Other abnormalities of gait and mobility: Secondary | ICD-10-CM | POA: Diagnosis not present

## 2016-02-27 DIAGNOSIS — G40901 Epilepsy, unspecified, not intractable, with status epilepticus: Secondary | ICD-10-CM | POA: Diagnosis not present

## 2016-02-27 DIAGNOSIS — S76191S Other specified injury of right quadriceps muscle, fascia and tendon, sequela: Secondary | ICD-10-CM | POA: Diagnosis not present

## 2016-02-27 DIAGNOSIS — M1990 Unspecified osteoarthritis, unspecified site: Secondary | ICD-10-CM | POA: Diagnosis not present

## 2016-02-27 DIAGNOSIS — Z96651 Presence of right artificial knee joint: Secondary | ICD-10-CM | POA: Diagnosis not present

## 2016-02-27 DIAGNOSIS — I10 Essential (primary) hypertension: Secondary | ICD-10-CM | POA: Diagnosis not present

## 2016-02-27 DIAGNOSIS — R2689 Other abnormalities of gait and mobility: Secondary | ICD-10-CM | POA: Diagnosis not present

## 2016-02-29 DIAGNOSIS — T148 Other injury of unspecified body region: Secondary | ICD-10-CM | POA: Diagnosis not present

## 2016-02-29 DIAGNOSIS — S32038A Other fracture of third lumbar vertebra, initial encounter for closed fracture: Secondary | ICD-10-CM | POA: Diagnosis not present

## 2016-02-29 DIAGNOSIS — S32018A Other fracture of first lumbar vertebra, initial encounter for closed fracture: Secondary | ICD-10-CM | POA: Diagnosis not present

## 2016-02-29 DIAGNOSIS — M549 Dorsalgia, unspecified: Secondary | ICD-10-CM | POA: Diagnosis not present

## 2016-02-29 DIAGNOSIS — S32008A Other fracture of unspecified lumbar vertebra, initial encounter for closed fracture: Secondary | ICD-10-CM | POA: Diagnosis not present

## 2016-02-29 DIAGNOSIS — S3991XA Unspecified injury of abdomen, initial encounter: Secondary | ICD-10-CM | POA: Diagnosis not present

## 2016-02-29 DIAGNOSIS — R109 Unspecified abdominal pain: Secondary | ICD-10-CM | POA: Diagnosis not present

## 2016-03-02 DIAGNOSIS — S83006A Unspecified dislocation of unspecified patella, initial encounter: Secondary | ICD-10-CM | POA: Diagnosis not present

## 2016-03-02 DIAGNOSIS — S32008A Other fracture of unspecified lumbar vertebra, initial encounter for closed fracture: Secondary | ICD-10-CM | POA: Diagnosis not present

## 2016-03-04 DIAGNOSIS — I499 Cardiac arrhythmia, unspecified: Secondary | ICD-10-CM | POA: Diagnosis not present

## 2016-03-04 DIAGNOSIS — R5383 Other fatigue: Secondary | ICD-10-CM | POA: Diagnosis not present

## 2016-03-04 DIAGNOSIS — R9431 Abnormal electrocardiogram [ECG] [EKG]: Secondary | ICD-10-CM | POA: Diagnosis not present

## 2016-03-10 DIAGNOSIS — S76191S Other specified injury of right quadriceps muscle, fascia and tendon, sequela: Secondary | ICD-10-CM | POA: Diagnosis not present

## 2016-03-10 DIAGNOSIS — Z96651 Presence of right artificial knee joint: Secondary | ICD-10-CM | POA: Diagnosis not present

## 2016-03-10 DIAGNOSIS — M1990 Unspecified osteoarthritis, unspecified site: Secondary | ICD-10-CM | POA: Diagnosis not present

## 2016-03-10 DIAGNOSIS — R2689 Other abnormalities of gait and mobility: Secondary | ICD-10-CM | POA: Diagnosis not present

## 2016-03-10 DIAGNOSIS — I10 Essential (primary) hypertension: Secondary | ICD-10-CM | POA: Diagnosis not present

## 2016-03-10 DIAGNOSIS — G40901 Epilepsy, unspecified, not intractable, with status epilepticus: Secondary | ICD-10-CM | POA: Diagnosis not present

## 2016-03-11 DIAGNOSIS — S76191S Other specified injury of right quadriceps muscle, fascia and tendon, sequela: Secondary | ICD-10-CM | POA: Diagnosis not present

## 2016-03-11 DIAGNOSIS — I10 Essential (primary) hypertension: Secondary | ICD-10-CM | POA: Diagnosis not present

## 2016-03-11 DIAGNOSIS — Z96651 Presence of right artificial knee joint: Secondary | ICD-10-CM | POA: Diagnosis not present

## 2016-03-11 DIAGNOSIS — R2689 Other abnormalities of gait and mobility: Secondary | ICD-10-CM | POA: Diagnosis not present

## 2016-03-11 DIAGNOSIS — M1990 Unspecified osteoarthritis, unspecified site: Secondary | ICD-10-CM | POA: Diagnosis not present

## 2016-03-11 DIAGNOSIS — G40901 Epilepsy, unspecified, not intractable, with status epilepticus: Secondary | ICD-10-CM | POA: Diagnosis not present

## 2016-03-12 DIAGNOSIS — S22080A Wedge compression fracture of T11-T12 vertebra, initial encounter for closed fracture: Secondary | ICD-10-CM | POA: Diagnosis not present

## 2016-03-13 DIAGNOSIS — M1990 Unspecified osteoarthritis, unspecified site: Secondary | ICD-10-CM | POA: Diagnosis not present

## 2016-03-13 DIAGNOSIS — S76191S Other specified injury of right quadriceps muscle, fascia and tendon, sequela: Secondary | ICD-10-CM | POA: Diagnosis not present

## 2016-03-13 DIAGNOSIS — Z96651 Presence of right artificial knee joint: Secondary | ICD-10-CM | POA: Diagnosis not present

## 2016-03-13 DIAGNOSIS — G40901 Epilepsy, unspecified, not intractable, with status epilepticus: Secondary | ICD-10-CM | POA: Diagnosis not present

## 2016-03-13 DIAGNOSIS — R2689 Other abnormalities of gait and mobility: Secondary | ICD-10-CM | POA: Diagnosis not present

## 2016-03-13 DIAGNOSIS — I10 Essential (primary) hypertension: Secondary | ICD-10-CM | POA: Diagnosis not present

## 2016-03-16 DIAGNOSIS — I10 Essential (primary) hypertension: Secondary | ICD-10-CM | POA: Diagnosis not present

## 2016-03-16 DIAGNOSIS — R2689 Other abnormalities of gait and mobility: Secondary | ICD-10-CM | POA: Diagnosis not present

## 2016-03-16 DIAGNOSIS — M1990 Unspecified osteoarthritis, unspecified site: Secondary | ICD-10-CM | POA: Diagnosis not present

## 2016-03-16 DIAGNOSIS — Z96651 Presence of right artificial knee joint: Secondary | ICD-10-CM | POA: Diagnosis not present

## 2016-03-16 DIAGNOSIS — S76191S Other specified injury of right quadriceps muscle, fascia and tendon, sequela: Secondary | ICD-10-CM | POA: Diagnosis not present

## 2016-03-16 DIAGNOSIS — G40901 Epilepsy, unspecified, not intractable, with status epilepticus: Secondary | ICD-10-CM | POA: Diagnosis not present

## 2016-03-18 DIAGNOSIS — M1990 Unspecified osteoarthritis, unspecified site: Secondary | ICD-10-CM | POA: Diagnosis not present

## 2016-03-18 DIAGNOSIS — Z96651 Presence of right artificial knee joint: Secondary | ICD-10-CM | POA: Diagnosis not present

## 2016-03-18 DIAGNOSIS — R2689 Other abnormalities of gait and mobility: Secondary | ICD-10-CM | POA: Diagnosis not present

## 2016-03-18 DIAGNOSIS — G40901 Epilepsy, unspecified, not intractable, with status epilepticus: Secondary | ICD-10-CM | POA: Diagnosis not present

## 2016-03-18 DIAGNOSIS — I10 Essential (primary) hypertension: Secondary | ICD-10-CM | POA: Diagnosis not present

## 2016-03-18 DIAGNOSIS — S76191S Other specified injury of right quadriceps muscle, fascia and tendon, sequela: Secondary | ICD-10-CM | POA: Diagnosis not present

## 2016-03-26 DIAGNOSIS — G40019 Localization-related (focal) (partial) idiopathic epilepsy and epileptic syndromes with seizures of localized onset, intractable, without status epilepticus: Secondary | ICD-10-CM | POA: Diagnosis not present

## 2016-03-26 DIAGNOSIS — F0150 Vascular dementia without behavioral disturbance: Secondary | ICD-10-CM | POA: Diagnosis not present

## 2016-03-26 DIAGNOSIS — F411 Generalized anxiety disorder: Secondary | ICD-10-CM | POA: Diagnosis not present

## 2016-03-27 DIAGNOSIS — Z96651 Presence of right artificial knee joint: Secondary | ICD-10-CM | POA: Diagnosis not present

## 2016-03-27 DIAGNOSIS — G40901 Epilepsy, unspecified, not intractable, with status epilepticus: Secondary | ICD-10-CM | POA: Diagnosis not present

## 2016-03-27 DIAGNOSIS — I10 Essential (primary) hypertension: Secondary | ICD-10-CM | POA: Diagnosis not present

## 2016-03-27 DIAGNOSIS — M1990 Unspecified osteoarthritis, unspecified site: Secondary | ICD-10-CM | POA: Diagnosis not present

## 2016-03-27 DIAGNOSIS — R2689 Other abnormalities of gait and mobility: Secondary | ICD-10-CM | POA: Diagnosis not present

## 2016-03-27 DIAGNOSIS — S76191S Other specified injury of right quadriceps muscle, fascia and tendon, sequela: Secondary | ICD-10-CM | POA: Diagnosis not present

## 2016-04-07 DIAGNOSIS — M542 Cervicalgia: Secondary | ICD-10-CM | POA: Diagnosis not present

## 2016-04-07 DIAGNOSIS — S0003XA Contusion of scalp, initial encounter: Secondary | ICD-10-CM | POA: Diagnosis not present

## 2016-04-07 DIAGNOSIS — M25561 Pain in right knee: Secondary | ICD-10-CM | POA: Diagnosis not present

## 2016-04-07 DIAGNOSIS — M25569 Pain in unspecified knee: Secondary | ICD-10-CM | POA: Diagnosis not present

## 2016-04-07 DIAGNOSIS — T148 Other injury of unspecified body region: Secondary | ICD-10-CM | POA: Diagnosis not present

## 2016-04-07 DIAGNOSIS — R51 Headache: Secondary | ICD-10-CM | POA: Diagnosis not present

## 2016-04-07 DIAGNOSIS — S0093XA Contusion of unspecified part of head, initial encounter: Secondary | ICD-10-CM | POA: Diagnosis not present

## 2016-04-21 DIAGNOSIS — E538 Deficiency of other specified B group vitamins: Secondary | ICD-10-CM | POA: Diagnosis not present

## 2016-04-21 DIAGNOSIS — D649 Anemia, unspecified: Secondary | ICD-10-CM | POA: Diagnosis not present

## 2016-05-01 DIAGNOSIS — F0150 Vascular dementia without behavioral disturbance: Secondary | ICD-10-CM | POA: Diagnosis not present

## 2016-05-01 DIAGNOSIS — F411 Generalized anxiety disorder: Secondary | ICD-10-CM | POA: Diagnosis not present

## 2016-05-01 DIAGNOSIS — G40019 Localization-related (focal) (partial) idiopathic epilepsy and epileptic syndromes with seizures of localized onset, intractable, without status epilepticus: Secondary | ICD-10-CM | POA: Diagnosis not present

## 2016-05-12 DIAGNOSIS — R634 Abnormal weight loss: Secondary | ICD-10-CM | POA: Diagnosis not present

## 2016-05-12 DIAGNOSIS — D500 Iron deficiency anemia secondary to blood loss (chronic): Secondary | ICD-10-CM | POA: Diagnosis not present

## 2016-05-24 DIAGNOSIS — R1013 Epigastric pain: Secondary | ICD-10-CM | POA: Diagnosis not present

## 2016-05-24 DIAGNOSIS — I10 Essential (primary) hypertension: Secondary | ICD-10-CM | POA: Diagnosis not present

## 2016-05-24 DIAGNOSIS — R079 Chest pain, unspecified: Secondary | ICD-10-CM | POA: Diagnosis not present

## 2016-05-24 DIAGNOSIS — R0789 Other chest pain: Secondary | ICD-10-CM | POA: Diagnosis not present

## 2016-06-02 DIAGNOSIS — D500 Iron deficiency anemia secondary to blood loss (chronic): Secondary | ICD-10-CM | POA: Diagnosis not present

## 2016-06-02 DIAGNOSIS — K7689 Other specified diseases of liver: Secondary | ICD-10-CM | POA: Diagnosis not present

## 2016-06-02 DIAGNOSIS — D3500 Benign neoplasm of unspecified adrenal gland: Secondary | ICD-10-CM | POA: Diagnosis not present

## 2016-06-02 DIAGNOSIS — N281 Cyst of kidney, acquired: Secondary | ICD-10-CM | POA: Diagnosis not present

## 2016-06-02 DIAGNOSIS — R634 Abnormal weight loss: Secondary | ICD-10-CM | POA: Diagnosis not present

## 2016-06-04 DIAGNOSIS — F0150 Vascular dementia without behavioral disturbance: Secondary | ICD-10-CM | POA: Diagnosis not present

## 2016-06-04 DIAGNOSIS — G40019 Localization-related (focal) (partial) idiopathic epilepsy and epileptic syndromes with seizures of localized onset, intractable, without status epilepticus: Secondary | ICD-10-CM | POA: Diagnosis not present

## 2016-06-04 DIAGNOSIS — F411 Generalized anxiety disorder: Secondary | ICD-10-CM | POA: Diagnosis not present

## 2016-06-16 DIAGNOSIS — S76119A Strain of unspecified quadriceps muscle, fascia and tendon, initial encounter: Secondary | ICD-10-CM | POA: Diagnosis not present

## 2016-06-17 DIAGNOSIS — E119 Type 2 diabetes mellitus without complications: Secondary | ICD-10-CM | POA: Diagnosis not present

## 2016-06-17 DIAGNOSIS — S0990XA Unspecified injury of head, initial encounter: Secondary | ICD-10-CM | POA: Diagnosis not present

## 2016-06-17 DIAGNOSIS — R402110 Coma scale, eyes open, never, unspecified time: Secondary | ICD-10-CM | POA: Diagnosis not present

## 2016-06-17 DIAGNOSIS — R51 Headache: Secondary | ICD-10-CM | POA: Diagnosis not present

## 2016-06-17 DIAGNOSIS — S065X0A Traumatic subdural hemorrhage without loss of consciousness, initial encounter: Secondary | ICD-10-CM | POA: Diagnosis not present

## 2016-06-17 DIAGNOSIS — G939 Disorder of brain, unspecified: Secondary | ICD-10-CM | POA: Diagnosis not present

## 2016-06-17 DIAGNOSIS — I10 Essential (primary) hypertension: Secondary | ICD-10-CM | POA: Diagnosis not present

## 2016-06-17 DIAGNOSIS — R259 Unspecified abnormal involuntary movements: Secondary | ICD-10-CM | POA: Diagnosis not present

## 2016-06-17 DIAGNOSIS — I6200 Nontraumatic subdural hemorrhage, unspecified: Secondary | ICD-10-CM | POA: Diagnosis not present

## 2016-06-17 DIAGNOSIS — R40241 Glasgow coma scale score 13-15, unspecified time: Secondary | ICD-10-CM | POA: Diagnosis not present

## 2016-06-17 DIAGNOSIS — F0390 Unspecified dementia without behavioral disturbance: Secondary | ICD-10-CM | POA: Diagnosis present

## 2016-06-17 DIAGNOSIS — S098XXA Other specified injuries of head, initial encounter: Secondary | ICD-10-CM | POA: Diagnosis not present

## 2016-06-17 DIAGNOSIS — R402310 Coma scale, best motor response, none, unspecified time: Secondary | ICD-10-CM | POA: Diagnosis not present

## 2016-06-17 DIAGNOSIS — S065X9A Traumatic subdural hemorrhage with loss of consciousness of unspecified duration, initial encounter: Secondary | ICD-10-CM | POA: Diagnosis not present

## 2016-06-17 DIAGNOSIS — R402210 Coma scale, best verbal response, none, unspecified time: Secondary | ICD-10-CM | POA: Diagnosis not present

## 2016-06-22 DIAGNOSIS — Z96653 Presence of artificial knee joint, bilateral: Secondary | ICD-10-CM | POA: Diagnosis not present

## 2016-06-22 DIAGNOSIS — G40909 Epilepsy, unspecified, not intractable, without status epilepticus: Secondary | ICD-10-CM | POA: Diagnosis not present

## 2016-06-22 DIAGNOSIS — Z7982 Long term (current) use of aspirin: Secondary | ICD-10-CM | POA: Diagnosis not present

## 2016-06-22 DIAGNOSIS — W19XXXD Unspecified fall, subsequent encounter: Secondary | ICD-10-CM | POA: Diagnosis not present

## 2016-06-22 DIAGNOSIS — Z9181 History of falling: Secondary | ICD-10-CM | POA: Diagnosis not present

## 2016-06-22 DIAGNOSIS — F0390 Unspecified dementia without behavioral disturbance: Secondary | ICD-10-CM | POA: Diagnosis not present

## 2016-06-22 DIAGNOSIS — R260 Ataxic gait: Secondary | ICD-10-CM | POA: Diagnosis not present

## 2016-06-22 DIAGNOSIS — H532 Diplopia: Secondary | ICD-10-CM | POA: Diagnosis not present

## 2016-06-22 DIAGNOSIS — M1990 Unspecified osteoarthritis, unspecified site: Secondary | ICD-10-CM | POA: Diagnosis not present

## 2016-06-22 DIAGNOSIS — I10 Essential (primary) hypertension: Secondary | ICD-10-CM | POA: Diagnosis not present

## 2016-06-22 DIAGNOSIS — S065X0D Traumatic subdural hemorrhage without loss of consciousness, subsequent encounter: Secondary | ICD-10-CM | POA: Diagnosis not present

## 2016-06-22 DIAGNOSIS — K219 Gastro-esophageal reflux disease without esophagitis: Secondary | ICD-10-CM | POA: Diagnosis not present

## 2016-06-27 DIAGNOSIS — S0990XA Unspecified injury of head, initial encounter: Secondary | ICD-10-CM | POA: Diagnosis not present

## 2016-06-27 DIAGNOSIS — S0003XA Contusion of scalp, initial encounter: Secondary | ICD-10-CM | POA: Diagnosis not present

## 2016-06-27 DIAGNOSIS — Z743 Need for continuous supervision: Secondary | ICD-10-CM | POA: Diagnosis not present

## 2016-06-27 DIAGNOSIS — R279 Unspecified lack of coordination: Secondary | ICD-10-CM | POA: Diagnosis not present

## 2016-06-27 DIAGNOSIS — S065X0A Traumatic subdural hemorrhage without loss of consciousness, initial encounter: Secondary | ICD-10-CM | POA: Diagnosis not present

## 2016-06-27 DIAGNOSIS — S064X0A Epidural hemorrhage without loss of consciousness, initial encounter: Secondary | ICD-10-CM | POA: Diagnosis not present

## 2016-07-07 DIAGNOSIS — M159 Polyosteoarthritis, unspecified: Secondary | ICD-10-CM | POA: Diagnosis not present

## 2016-07-07 DIAGNOSIS — Z6828 Body mass index (BMI) 28.0-28.9, adult: Secondary | ICD-10-CM | POA: Diagnosis not present

## 2016-07-07 DIAGNOSIS — S065X0D Traumatic subdural hemorrhage without loss of consciousness, subsequent encounter: Secondary | ICD-10-CM | POA: Diagnosis not present

## 2016-07-07 DIAGNOSIS — M1990 Unspecified osteoarthritis, unspecified site: Secondary | ICD-10-CM | POA: Diagnosis not present

## 2016-07-07 DIAGNOSIS — F0390 Unspecified dementia without behavioral disturbance: Secondary | ICD-10-CM | POA: Diagnosis not present

## 2016-07-07 DIAGNOSIS — M25561 Pain in right knee: Secondary | ICD-10-CM | POA: Diagnosis not present

## 2016-07-07 DIAGNOSIS — I10 Essential (primary) hypertension: Secondary | ICD-10-CM | POA: Diagnosis not present

## 2016-07-07 DIAGNOSIS — R269 Unspecified abnormalities of gait and mobility: Secondary | ICD-10-CM | POA: Diagnosis not present

## 2016-07-07 DIAGNOSIS — W19XXXD Unspecified fall, subsequent encounter: Secondary | ICD-10-CM | POA: Diagnosis not present

## 2016-07-07 DIAGNOSIS — R260 Ataxic gait: Secondary | ICD-10-CM | POA: Diagnosis not present

## 2016-07-09 DIAGNOSIS — S065X0D Traumatic subdural hemorrhage without loss of consciousness, subsequent encounter: Secondary | ICD-10-CM | POA: Diagnosis not present

## 2016-07-09 DIAGNOSIS — W19XXXD Unspecified fall, subsequent encounter: Secondary | ICD-10-CM | POA: Diagnosis not present

## 2016-07-09 DIAGNOSIS — I10 Essential (primary) hypertension: Secondary | ICD-10-CM | POA: Diagnosis not present

## 2016-07-09 DIAGNOSIS — F0390 Unspecified dementia without behavioral disturbance: Secondary | ICD-10-CM | POA: Diagnosis not present

## 2016-07-09 DIAGNOSIS — R260 Ataxic gait: Secondary | ICD-10-CM | POA: Diagnosis not present

## 2016-07-09 DIAGNOSIS — M1990 Unspecified osteoarthritis, unspecified site: Secondary | ICD-10-CM | POA: Diagnosis not present

## 2016-07-10 DIAGNOSIS — R260 Ataxic gait: Secondary | ICD-10-CM | POA: Diagnosis not present

## 2016-07-10 DIAGNOSIS — S065X0D Traumatic subdural hemorrhage without loss of consciousness, subsequent encounter: Secondary | ICD-10-CM | POA: Diagnosis not present

## 2016-07-10 DIAGNOSIS — F0390 Unspecified dementia without behavioral disturbance: Secondary | ICD-10-CM | POA: Diagnosis not present

## 2016-07-10 DIAGNOSIS — M1990 Unspecified osteoarthritis, unspecified site: Secondary | ICD-10-CM | POA: Diagnosis not present

## 2016-07-10 DIAGNOSIS — W19XXXD Unspecified fall, subsequent encounter: Secondary | ICD-10-CM | POA: Diagnosis not present

## 2016-07-10 DIAGNOSIS — I10 Essential (primary) hypertension: Secondary | ICD-10-CM | POA: Diagnosis not present

## 2016-07-14 DIAGNOSIS — F0390 Unspecified dementia without behavioral disturbance: Secondary | ICD-10-CM | POA: Diagnosis not present

## 2016-07-14 DIAGNOSIS — M1990 Unspecified osteoarthritis, unspecified site: Secondary | ICD-10-CM | POA: Diagnosis not present

## 2016-07-14 DIAGNOSIS — I10 Essential (primary) hypertension: Secondary | ICD-10-CM | POA: Diagnosis not present

## 2016-07-14 DIAGNOSIS — W19XXXD Unspecified fall, subsequent encounter: Secondary | ICD-10-CM | POA: Diagnosis not present

## 2016-07-14 DIAGNOSIS — R260 Ataxic gait: Secondary | ICD-10-CM | POA: Diagnosis not present

## 2016-07-14 DIAGNOSIS — S065X0D Traumatic subdural hemorrhage without loss of consciousness, subsequent encounter: Secondary | ICD-10-CM | POA: Diagnosis not present

## 2016-07-16 DIAGNOSIS — I10 Essential (primary) hypertension: Secondary | ICD-10-CM | POA: Diagnosis not present

## 2016-07-16 DIAGNOSIS — M1990 Unspecified osteoarthritis, unspecified site: Secondary | ICD-10-CM | POA: Diagnosis not present

## 2016-07-16 DIAGNOSIS — S065X0D Traumatic subdural hemorrhage without loss of consciousness, subsequent encounter: Secondary | ICD-10-CM | POA: Diagnosis not present

## 2016-07-16 DIAGNOSIS — R260 Ataxic gait: Secondary | ICD-10-CM | POA: Diagnosis not present

## 2016-07-16 DIAGNOSIS — W19XXXD Unspecified fall, subsequent encounter: Secondary | ICD-10-CM | POA: Diagnosis not present

## 2016-07-16 DIAGNOSIS — F0390 Unspecified dementia without behavioral disturbance: Secondary | ICD-10-CM | POA: Diagnosis not present

## 2016-07-21 DIAGNOSIS — R260 Ataxic gait: Secondary | ICD-10-CM | POA: Diagnosis not present

## 2016-07-21 DIAGNOSIS — M1990 Unspecified osteoarthritis, unspecified site: Secondary | ICD-10-CM | POA: Diagnosis not present

## 2016-07-21 DIAGNOSIS — S065X0D Traumatic subdural hemorrhage without loss of consciousness, subsequent encounter: Secondary | ICD-10-CM | POA: Diagnosis not present

## 2016-07-21 DIAGNOSIS — I10 Essential (primary) hypertension: Secondary | ICD-10-CM | POA: Diagnosis not present

## 2016-07-21 DIAGNOSIS — W19XXXD Unspecified fall, subsequent encounter: Secondary | ICD-10-CM | POA: Diagnosis not present

## 2016-07-21 DIAGNOSIS — F0390 Unspecified dementia without behavioral disturbance: Secondary | ICD-10-CM | POA: Diagnosis not present

## 2016-07-23 DIAGNOSIS — R260 Ataxic gait: Secondary | ICD-10-CM | POA: Diagnosis not present

## 2016-07-23 DIAGNOSIS — M1990 Unspecified osteoarthritis, unspecified site: Secondary | ICD-10-CM | POA: Diagnosis not present

## 2016-07-23 DIAGNOSIS — W19XXXD Unspecified fall, subsequent encounter: Secondary | ICD-10-CM | POA: Diagnosis not present

## 2016-07-23 DIAGNOSIS — I10 Essential (primary) hypertension: Secondary | ICD-10-CM | POA: Diagnosis not present

## 2016-07-23 DIAGNOSIS — S065X0D Traumatic subdural hemorrhage without loss of consciousness, subsequent encounter: Secondary | ICD-10-CM | POA: Diagnosis not present

## 2016-07-23 DIAGNOSIS — F0390 Unspecified dementia without behavioral disturbance: Secondary | ICD-10-CM | POA: Diagnosis not present

## 2016-07-28 DIAGNOSIS — S065X0D Traumatic subdural hemorrhage without loss of consciousness, subsequent encounter: Secondary | ICD-10-CM | POA: Diagnosis not present

## 2016-07-28 DIAGNOSIS — R260 Ataxic gait: Secondary | ICD-10-CM | POA: Diagnosis not present

## 2016-07-28 DIAGNOSIS — M1990 Unspecified osteoarthritis, unspecified site: Secondary | ICD-10-CM | POA: Diagnosis not present

## 2016-07-28 DIAGNOSIS — Z1389 Encounter for screening for other disorder: Secondary | ICD-10-CM | POA: Diagnosis not present

## 2016-07-28 DIAGNOSIS — F321 Major depressive disorder, single episode, moderate: Secondary | ICD-10-CM | POA: Diagnosis not present

## 2016-07-28 DIAGNOSIS — W19XXXD Unspecified fall, subsequent encounter: Secondary | ICD-10-CM | POA: Diagnosis not present

## 2016-07-28 DIAGNOSIS — M25561 Pain in right knee: Secondary | ICD-10-CM | POA: Diagnosis not present

## 2016-07-28 DIAGNOSIS — I10 Essential (primary) hypertension: Secondary | ICD-10-CM | POA: Diagnosis not present

## 2016-07-28 DIAGNOSIS — F0390 Unspecified dementia without behavioral disturbance: Secondary | ICD-10-CM | POA: Diagnosis not present

## 2016-08-05 DIAGNOSIS — I6200 Nontraumatic subdural hemorrhage, unspecified: Secondary | ICD-10-CM | POA: Diagnosis not present

## 2016-08-05 DIAGNOSIS — R569 Unspecified convulsions: Secondary | ICD-10-CM | POA: Diagnosis not present

## 2016-08-05 DIAGNOSIS — R413 Other amnesia: Secondary | ICD-10-CM | POA: Diagnosis not present

## 2016-08-11 DIAGNOSIS — I720 Aneurysm of carotid artery: Secondary | ICD-10-CM | POA: Diagnosis not present

## 2016-08-11 DIAGNOSIS — R413 Other amnesia: Secondary | ICD-10-CM | POA: Diagnosis not present

## 2016-08-11 DIAGNOSIS — R569 Unspecified convulsions: Secondary | ICD-10-CM | POA: Diagnosis not present

## 2016-08-11 DIAGNOSIS — I6200 Nontraumatic subdural hemorrhage, unspecified: Secondary | ICD-10-CM | POA: Diagnosis not present

## 2016-08-26 DIAGNOSIS — R259 Unspecified abnormal involuntary movements: Secondary | ICD-10-CM | POA: Diagnosis not present

## 2016-08-26 DIAGNOSIS — S0003XA Contusion of scalp, initial encounter: Secondary | ICD-10-CM | POA: Diagnosis not present

## 2016-08-26 DIAGNOSIS — S199XXA Unspecified injury of neck, initial encounter: Secondary | ICD-10-CM | POA: Diagnosis not present

## 2016-08-26 DIAGNOSIS — S0990XA Unspecified injury of head, initial encounter: Secondary | ICD-10-CM | POA: Diagnosis not present

## 2016-08-26 DIAGNOSIS — S161XXA Strain of muscle, fascia and tendon at neck level, initial encounter: Secondary | ICD-10-CM | POA: Diagnosis not present

## 2016-08-26 DIAGNOSIS — R42 Dizziness and giddiness: Secondary | ICD-10-CM | POA: Diagnosis not present

## 2016-08-27 DIAGNOSIS — R4182 Altered mental status, unspecified: Secondary | ICD-10-CM | POA: Diagnosis not present

## 2016-08-27 DIAGNOSIS — F329 Major depressive disorder, single episode, unspecified: Secondary | ICD-10-CM | POA: Diagnosis not present

## 2016-09-01 DIAGNOSIS — R413 Other amnesia: Secondary | ICD-10-CM | POA: Diagnosis not present

## 2016-09-01 DIAGNOSIS — M47819 Spondylosis without myelopathy or radiculopathy, site unspecified: Secondary | ICD-10-CM | POA: Diagnosis not present

## 2016-09-01 DIAGNOSIS — R079 Chest pain, unspecified: Secondary | ICD-10-CM | POA: Diagnosis not present

## 2016-09-01 DIAGNOSIS — F068 Other specified mental disorders due to known physiological condition: Secondary | ICD-10-CM | POA: Diagnosis not present

## 2016-09-01 DIAGNOSIS — D649 Anemia, unspecified: Secondary | ICD-10-CM | POA: Diagnosis not present

## 2016-09-01 DIAGNOSIS — G40909 Epilepsy, unspecified, not intractable, without status epilepticus: Secondary | ICD-10-CM | POA: Diagnosis not present

## 2016-09-01 DIAGNOSIS — M8589 Other specified disorders of bone density and structure, multiple sites: Secondary | ICD-10-CM | POA: Diagnosis not present

## 2016-09-01 DIAGNOSIS — E785 Hyperlipidemia, unspecified: Secondary | ICD-10-CM | POA: Diagnosis not present

## 2016-09-01 DIAGNOSIS — Z79899 Other long term (current) drug therapy: Secondary | ICD-10-CM | POA: Diagnosis not present

## 2016-09-01 DIAGNOSIS — M19011 Primary osteoarthritis, right shoulder: Secondary | ICD-10-CM | POA: Diagnosis not present

## 2016-09-01 DIAGNOSIS — G894 Chronic pain syndrome: Secondary | ICD-10-CM | POA: Diagnosis not present

## 2016-09-01 DIAGNOSIS — Z7982 Long term (current) use of aspirin: Secondary | ICD-10-CM | POA: Diagnosis not present

## 2016-09-01 DIAGNOSIS — M19012 Primary osteoarthritis, left shoulder: Secondary | ICD-10-CM | POA: Diagnosis not present

## 2016-09-01 DIAGNOSIS — I10 Essential (primary) hypertension: Secondary | ICD-10-CM | POA: Diagnosis not present

## 2016-09-01 DIAGNOSIS — E7800 Pure hypercholesterolemia, unspecified: Secondary | ICD-10-CM | POA: Diagnosis not present

## 2016-09-02 DIAGNOSIS — R079 Chest pain, unspecified: Secondary | ICD-10-CM | POA: Diagnosis not present

## 2016-09-03 DIAGNOSIS — F329 Major depressive disorder, single episode, unspecified: Secondary | ICD-10-CM | POA: Diagnosis not present

## 2016-09-03 DIAGNOSIS — R4182 Altered mental status, unspecified: Secondary | ICD-10-CM | POA: Diagnosis not present

## 2016-09-08 DIAGNOSIS — R4182 Altered mental status, unspecified: Secondary | ICD-10-CM | POA: Diagnosis not present

## 2016-09-08 DIAGNOSIS — F329 Major depressive disorder, single episode, unspecified: Secondary | ICD-10-CM | POA: Diagnosis not present

## 2016-09-15 DIAGNOSIS — R4182 Altered mental status, unspecified: Secondary | ICD-10-CM | POA: Diagnosis not present

## 2016-09-15 DIAGNOSIS — F329 Major depressive disorder, single episode, unspecified: Secondary | ICD-10-CM | POA: Diagnosis not present

## 2016-09-17 DIAGNOSIS — S2239XA Fracture of one rib, unspecified side, initial encounter for closed fracture: Secondary | ICD-10-CM | POA: Diagnosis not present

## 2016-09-17 DIAGNOSIS — R52 Pain, unspecified: Secondary | ICD-10-CM | POA: Diagnosis not present

## 2016-09-17 DIAGNOSIS — R0781 Pleurodynia: Secondary | ICD-10-CM | POA: Diagnosis not present

## 2016-09-17 DIAGNOSIS — S2232XA Fracture of one rib, left side, initial encounter for closed fracture: Secondary | ICD-10-CM | POA: Diagnosis not present

## 2016-09-18 DIAGNOSIS — R259 Unspecified abnormal involuntary movements: Secondary | ICD-10-CM | POA: Diagnosis not present

## 2016-09-18 DIAGNOSIS — R0789 Other chest pain: Secondary | ICD-10-CM | POA: Diagnosis not present

## 2016-09-22 DIAGNOSIS — F329 Major depressive disorder, single episode, unspecified: Secondary | ICD-10-CM | POA: Diagnosis not present

## 2016-09-22 DIAGNOSIS — R4182 Altered mental status, unspecified: Secondary | ICD-10-CM | POA: Diagnosis not present

## 2016-09-24 DIAGNOSIS — R2681 Unsteadiness on feet: Secondary | ICD-10-CM | POA: Diagnosis not present

## 2016-09-24 DIAGNOSIS — M25561 Pain in right knee: Secondary | ICD-10-CM | POA: Diagnosis not present

## 2016-09-24 DIAGNOSIS — R262 Difficulty in walking, not elsewhere classified: Secondary | ICD-10-CM | POA: Diagnosis not present

## 2016-09-29 DIAGNOSIS — M25561 Pain in right knee: Secondary | ICD-10-CM | POA: Diagnosis not present

## 2016-09-29 DIAGNOSIS — R4182 Altered mental status, unspecified: Secondary | ICD-10-CM | POA: Diagnosis not present

## 2016-09-29 DIAGNOSIS — F329 Major depressive disorder, single episode, unspecified: Secondary | ICD-10-CM | POA: Diagnosis not present

## 2016-09-29 DIAGNOSIS — R2681 Unsteadiness on feet: Secondary | ICD-10-CM | POA: Diagnosis not present

## 2016-09-29 DIAGNOSIS — R262 Difficulty in walking, not elsewhere classified: Secondary | ICD-10-CM | POA: Diagnosis not present

## 2016-09-29 DIAGNOSIS — Z6828 Body mass index (BMI) 28.0-28.9, adult: Secondary | ICD-10-CM | POA: Diagnosis not present

## 2016-09-29 DIAGNOSIS — S2232XS Fracture of one rib, left side, sequela: Secondary | ICD-10-CM | POA: Diagnosis not present

## 2016-10-01 DIAGNOSIS — M25561 Pain in right knee: Secondary | ICD-10-CM | POA: Diagnosis not present

## 2016-10-01 DIAGNOSIS — R2681 Unsteadiness on feet: Secondary | ICD-10-CM | POA: Diagnosis not present

## 2016-10-01 DIAGNOSIS — R262 Difficulty in walking, not elsewhere classified: Secondary | ICD-10-CM | POA: Diagnosis not present

## 2016-10-05 DIAGNOSIS — M25561 Pain in right knee: Secondary | ICD-10-CM | POA: Diagnosis not present

## 2016-10-05 DIAGNOSIS — R2681 Unsteadiness on feet: Secondary | ICD-10-CM | POA: Diagnosis not present

## 2016-10-05 DIAGNOSIS — R262 Difficulty in walking, not elsewhere classified: Secondary | ICD-10-CM | POA: Diagnosis not present

## 2016-10-08 DIAGNOSIS — R296 Repeated falls: Secondary | ICD-10-CM | POA: Diagnosis not present

## 2016-10-08 DIAGNOSIS — F329 Major depressive disorder, single episode, unspecified: Secondary | ICD-10-CM | POA: Diagnosis not present

## 2016-10-08 DIAGNOSIS — M159 Polyosteoarthritis, unspecified: Secondary | ICD-10-CM | POA: Diagnosis not present

## 2016-10-08 DIAGNOSIS — R4182 Altered mental status, unspecified: Secondary | ICD-10-CM | POA: Diagnosis not present

## 2016-10-08 DIAGNOSIS — Z6828 Body mass index (BMI) 28.0-28.9, adult: Secondary | ICD-10-CM | POA: Diagnosis not present

## 2016-10-09 DIAGNOSIS — M79671 Pain in right foot: Secondary | ICD-10-CM | POA: Diagnosis not present

## 2016-10-09 DIAGNOSIS — M25571 Pain in right ankle and joints of right foot: Secondary | ICD-10-CM | POA: Diagnosis not present

## 2016-10-13 DIAGNOSIS — F329 Major depressive disorder, single episode, unspecified: Secondary | ICD-10-CM | POA: Diagnosis not present

## 2016-10-13 DIAGNOSIS — S92309A Fracture of unspecified metatarsal bone(s), unspecified foot, initial encounter for closed fracture: Secondary | ICD-10-CM | POA: Diagnosis not present

## 2016-10-15 DIAGNOSIS — S92321A Displaced fracture of second metatarsal bone, right foot, initial encounter for closed fracture: Secondary | ICD-10-CM | POA: Diagnosis not present

## 2016-10-15 DIAGNOSIS — S99921A Unspecified injury of right foot, initial encounter: Secondary | ICD-10-CM | POA: Diagnosis not present

## 2016-10-15 DIAGNOSIS — S92331A Displaced fracture of third metatarsal bone, right foot, initial encounter for closed fracture: Secondary | ICD-10-CM | POA: Diagnosis not present

## 2016-10-23 DIAGNOSIS — F329 Major depressive disorder, single episode, unspecified: Secondary | ICD-10-CM | POA: Diagnosis not present

## 2016-10-27 DIAGNOSIS — S92321G Displaced fracture of second metatarsal bone, right foot, subsequent encounter for fracture with delayed healing: Secondary | ICD-10-CM | POA: Diagnosis not present

## 2016-10-27 DIAGNOSIS — S92331G Displaced fracture of third metatarsal bone, right foot, subsequent encounter for fracture with delayed healing: Secondary | ICD-10-CM | POA: Diagnosis not present

## 2016-10-28 DIAGNOSIS — F329 Major depressive disorder, single episode, unspecified: Secondary | ICD-10-CM | POA: Diagnosis not present

## 2016-10-28 DIAGNOSIS — R4182 Altered mental status, unspecified: Secondary | ICD-10-CM | POA: Diagnosis not present

## 2016-11-16 DIAGNOSIS — R112 Nausea with vomiting, unspecified: Secondary | ICD-10-CM | POA: Diagnosis not present

## 2016-11-16 DIAGNOSIS — Z7401 Bed confinement status: Secondary | ICD-10-CM | POA: Diagnosis not present

## 2016-11-16 DIAGNOSIS — I10 Essential (primary) hypertension: Secondary | ICD-10-CM | POA: Diagnosis not present

## 2016-11-16 DIAGNOSIS — R1110 Vomiting, unspecified: Secondary | ICD-10-CM | POA: Diagnosis not present

## 2016-11-16 DIAGNOSIS — R531 Weakness: Secondary | ICD-10-CM | POA: Diagnosis not present

## 2016-11-17 DIAGNOSIS — N390 Urinary tract infection, site not specified: Secondary | ICD-10-CM | POA: Diagnosis not present

## 2016-11-17 DIAGNOSIS — R4182 Altered mental status, unspecified: Secondary | ICD-10-CM | POA: Diagnosis not present

## 2016-11-17 DIAGNOSIS — R402441 Other coma, without documented Glasgow coma scale score, or with partial score reported, in the field [EMT or ambulance]: Secondary | ICD-10-CM | POA: Diagnosis not present

## 2016-11-17 DIAGNOSIS — N3000 Acute cystitis without hematuria: Secondary | ICD-10-CM | POA: Diagnosis not present

## 2016-11-17 DIAGNOSIS — R410 Disorientation, unspecified: Secondary | ICD-10-CM | POA: Diagnosis not present

## 2016-11-17 DIAGNOSIS — J0120 Acute ethmoidal sinusitis, unspecified: Secondary | ICD-10-CM | POA: Diagnosis not present

## 2016-11-18 DIAGNOSIS — M205X1 Other deformities of toe(s) (acquired), right foot: Secondary | ICD-10-CM | POA: Diagnosis not present

## 2016-11-18 DIAGNOSIS — G629 Polyneuropathy, unspecified: Secondary | ICD-10-CM | POA: Diagnosis not present

## 2016-11-18 DIAGNOSIS — L602 Onychogryphosis: Secondary | ICD-10-CM | POA: Diagnosis not present

## 2016-11-18 DIAGNOSIS — L84 Corns and callosities: Secondary | ICD-10-CM | POA: Diagnosis not present

## 2016-11-25 DIAGNOSIS — F329 Major depressive disorder, single episode, unspecified: Secondary | ICD-10-CM | POA: Diagnosis not present

## 2016-12-01 DIAGNOSIS — S92331G Displaced fracture of third metatarsal bone, right foot, subsequent encounter for fracture with delayed healing: Secondary | ICD-10-CM | POA: Diagnosis not present

## 2016-12-01 DIAGNOSIS — S92321G Displaced fracture of second metatarsal bone, right foot, subsequent encounter for fracture with delayed healing: Secondary | ICD-10-CM | POA: Diagnosis not present

## 2016-12-14 DIAGNOSIS — F329 Major depressive disorder, single episode, unspecified: Secondary | ICD-10-CM | POA: Diagnosis not present

## 2016-12-15 DIAGNOSIS — S92331G Displaced fracture of third metatarsal bone, right foot, subsequent encounter for fracture with delayed healing: Secondary | ICD-10-CM | POA: Diagnosis not present

## 2016-12-15 DIAGNOSIS — S92321G Displaced fracture of second metatarsal bone, right foot, subsequent encounter for fracture with delayed healing: Secondary | ICD-10-CM | POA: Diagnosis not present

## 2016-12-22 DIAGNOSIS — S0083XA Contusion of other part of head, initial encounter: Secondary | ICD-10-CM | POA: Diagnosis not present

## 2016-12-22 DIAGNOSIS — S0180XA Unspecified open wound of other part of head, initial encounter: Secondary | ICD-10-CM | POA: Diagnosis not present

## 2016-12-22 DIAGNOSIS — S098XXA Other specified injuries of head, initial encounter: Secondary | ICD-10-CM | POA: Diagnosis not present

## 2016-12-23 DIAGNOSIS — R2689 Other abnormalities of gait and mobility: Secondary | ICD-10-CM | POA: Diagnosis not present

## 2016-12-23 DIAGNOSIS — R259 Unspecified abnormal involuntary movements: Secondary | ICD-10-CM | POA: Diagnosis not present

## 2016-12-29 DIAGNOSIS — F329 Major depressive disorder, single episode, unspecified: Secondary | ICD-10-CM | POA: Diagnosis not present

## 2017-01-05 DIAGNOSIS — Z1389 Encounter for screening for other disorder: Secondary | ICD-10-CM | POA: Diagnosis not present

## 2017-01-05 DIAGNOSIS — Z139 Encounter for screening, unspecified: Secondary | ICD-10-CM | POA: Diagnosis not present

## 2017-01-05 DIAGNOSIS — R296 Repeated falls: Secondary | ICD-10-CM | POA: Diagnosis not present

## 2017-01-05 DIAGNOSIS — M25561 Pain in right knee: Secondary | ICD-10-CM | POA: Diagnosis not present

## 2017-01-05 DIAGNOSIS — R269 Unspecified abnormalities of gait and mobility: Secondary | ICD-10-CM | POA: Diagnosis not present

## 2017-01-05 DIAGNOSIS — Z0000 Encounter for general adult medical examination without abnormal findings: Secondary | ICD-10-CM | POA: Diagnosis not present

## 2017-01-20 ENCOUNTER — Emergency Department (HOSPITAL_COMMUNITY)
Admission: EM | Admit: 2017-01-20 | Discharge: 2017-01-20 | Disposition: A | Discharge status: Home / Self Care | Payer: Medicare Other | Attending: Emergency Medicine | Admitting: Emergency Medicine

## 2017-01-20 ENCOUNTER — Encounter (HOSPITAL_COMMUNITY): Payer: Self-pay | Admitting: Emergency Medicine

## 2017-01-20 ENCOUNTER — Emergency Department (HOSPITAL_COMMUNITY): Payer: Medicare Other

## 2017-01-20 DIAGNOSIS — Z79899 Other long term (current) drug therapy: Secondary | ICD-10-CM | POA: Diagnosis not present

## 2017-01-20 DIAGNOSIS — G40909 Epilepsy, unspecified, not intractable, without status epilepticus: Secondary | ICD-10-CM | POA: Diagnosis not present

## 2017-01-20 DIAGNOSIS — Z7982 Long term (current) use of aspirin: Secondary | ICD-10-CM | POA: Insufficient documentation

## 2017-01-20 DIAGNOSIS — R569 Unspecified convulsions: Secondary | ICD-10-CM | POA: Diagnosis not present

## 2017-01-20 DIAGNOSIS — R4182 Altered mental status, unspecified: Secondary | ICD-10-CM | POA: Diagnosis not present

## 2017-01-20 DIAGNOSIS — G40901 Epilepsy, unspecified, not intractable, with status epilepticus: Secondary | ICD-10-CM | POA: Diagnosis not present

## 2017-01-20 HISTORY — DX: Unspecified convulsions: R56.9

## 2017-01-20 LAB — COMPREHENSIVE METABOLIC PANEL
ALBUMIN: 3.9 g/dL (ref 3.5–5.0)
ALT: 11 U/L — ABNORMAL LOW (ref 14–54)
ANION GAP: 14 (ref 5–15)
AST: 27 U/L (ref 15–41)
Alkaline Phosphatase: 125 U/L (ref 38–126)
BUN: 10 mg/dL (ref 6–20)
CO2: 24 mmol/L (ref 22–32)
Calcium: 9.3 mg/dL (ref 8.9–10.3)
Chloride: 99 mmol/L — ABNORMAL LOW (ref 101–111)
Creatinine, Ser: 0.74 mg/dL (ref 0.44–1.00)
GFR calc Af Amer: >60 mL/min (ref >60)
GFR calc non Af Amer: >60 mL/min (ref >60)
GLUCOSE: 105 mg/dL — AB (ref 65–99)
Potassium: 3.4 mmol/L — ABNORMAL LOW (ref 3.5–5.1)
SODIUM: 137 mmol/L (ref 135–145)
Total Bilirubin: 0.6 mg/dL (ref 0.3–1.2)
Total Protein: 7.3 g/dL (ref 6.5–8.1)

## 2017-01-20 LAB — I-STAT TROPONIN, ED: Troponin i, poc: 0 ng/mL (ref 0.00–0.08)

## 2017-01-20 LAB — CBC WITH DIFFERENTIAL/PLATELET
BASOS PCT: 0 %
Basophils Absolute: 0 10*3/uL (ref 0.0–0.1)
EOS PCT: 1 %
Eosinophils Absolute: 0.1 10*3/uL (ref 0.0–0.7)
HCT: 39.7 % (ref 36.0–46.0)
Hemoglobin: 13.7 g/dL (ref 12.0–15.0)
LYMPHS ABS: 0.7 10*3/uL (ref 0.7–4.0)
Lymphocytes Relative: 10 %
MCH: 31.8 pg (ref 26.0–34.0)
MCHC: 34.5 g/dL (ref 30.0–36.0)
MCV: 92.1 fL (ref 78.0–100.0)
MONOS PCT: 9 %
Monocytes Absolute: 0.6 10*3/uL (ref 0.1–1.0)
NEUTROS ABS: 5.2 10*3/uL (ref 1.7–7.7)
Neutrophils Relative %: 80 %
Platelets: ADEQUATE 10*3/uL (ref 150–400)
RBC: 4.31 MIL/uL (ref 3.87–5.11)
RDW: 12.8 % (ref 11.5–15.5)
WBC: 6.6 10*3/uL (ref 4.0–10.5)

## 2017-01-20 LAB — URINALYSIS, ROUTINE W REFLEX MICROSCOPIC
Bilirubin Urine: NEGATIVE
Glucose, UA: NEGATIVE mg/dL
KETONES UR: NEGATIVE mg/dL
LEUKOCYTES UA: NEGATIVE
Nitrite: NEGATIVE
PH: 8 (ref 5.0–8.0)
Protein, ur: NEGATIVE mg/dL
Specific Gravity, Urine: 1.006 (ref 1.005–1.030)

## 2017-01-20 LAB — PHENYTOIN LEVEL, TOTAL: Phenytoin Lvl: 8.3 ug/mL — ABNORMAL LOW (ref 10.0–20.0)

## 2017-01-20 LAB — CBG MONITORING, ED: GLUCOSE-CAPILLARY: 85 mg/dL (ref 65–99)

## 2017-01-20 LAB — PATHOLOGIST SMEAR REVIEW

## 2017-01-20 LAB — PHENOBARBITAL LEVEL: Phenobarbital: 10.5 ug/mL — ABNORMAL LOW (ref 15.0–30.0)

## 2017-01-20 MED ORDER — SODIUM CHLORIDE 0.9 % IV SOLN
500.0000 mg | Freq: Once | INTRAVENOUS | Status: AC
  Administered 2017-01-20: 500 mg via INTRAVENOUS
  Filled 2017-01-20: qty 10

## 2017-01-20 NOTE — Discharge Instructions (Signed)
Your levels of your phenytoin and phenobarbital (seizure medications) were low today.  Increase your phenytoin to 100 mg in the morning and 150 mg at night.  Get your level of phenytoin rechecked in the next 5 days.  Please follow up with your Neurologist for further medication adjustments.

## 2017-01-20 NOTE — ED Triage Notes (Signed)
Pt to ER by [facility] coming from [facility] [address] for evaluation of altered mental status after witnessed seizure. Pt was being wheeled down the hallway when she fell forward out of the chair with seizure activity. Hx of seizures. Staff reports episode lasted 3 minutes and then patient was unresponsive. Per EMS, patient hypertensive at 186/100 with asymmetrical pupils, left 3 mm, right 5 mm. Pt now able to follow commands and answer most questions. No IV in place. CBG 134.

## 2017-01-20 NOTE — ED Provider Notes (Signed)
[facility] DEPT Provider Note   CSN: [id number] Arrival date & time: 01/20/17  [id number]     History   Chief Complaint Chief Complaint  Patient presents with  . Seizures  . Altered Mental Status    HPI Brenda Powers is a 83 y.o. female.  The history is provided by the patient and the EMS personnel. No language interpreter was used.  Seizures    Altered Mental Status   Associated symptoms include seizures.   Brenda Powers is a 83 y.o. female who presents to the Emergency Department complaining of seizure.  She presents via EMS for evaluation of seizure. She is coming from [place]. She has a history of seizure disorder and was in a wheelchair on her way to breakfast when she had seizure activity lasting approximately 3 minutes. She fell out of the wheelchair and struck her head. She has been post ictal since the event. Level 5 caveat due to confusion.  Past Medical History:  Diagnosis Date  . Seizures ([place])     There are no active problems to display for this patient.   History reviewed. No pertinent surgical history.  OB History    No data available       Home Medications    Prior to Admission medications   Medication Sig Start Date End Date Taking? Authorizing Provider  acetaminophen (TYLENOL) 325 MG tablet Take 650 mg by mouth every 6 (six) hours as needed for mild pain.   Yes Historical Provider, MD  alendronate (FOSAMAX) 70 MG tablet Take 70 mg by mouth once a week. Take with a full glass of water on an empty stomach.   Yes Historical Provider, MD  aspirin 81 MG chewable tablet Chew 81 mg by mouth daily.   Yes Historical Provider, MD  bisoprolol (ZEBETA) 5 MG tablet Take 5 mg by mouth daily.   Yes Historical Provider, MD  cyanocobalamin (,VITAMIN B-12,) 1000 MCG/ML injection Inject 1,000 mcg into the muscle every 30 (thirty) days.   Yes Historical Provider, MD  DULoxetine (CYMBALTA) 60 MG capsule Take 60 mg by mouth daily.   Yes Historical  Provider, MD  Ergocalciferol (VITAMIN D2 PO) Take 1 capsule by mouth once a week.   Yes Historical Provider, MD  ferrous sulfate 325 (65 FE) MG tablet Take 325 mg by mouth 2 (two) times daily with a meal.   Yes Historical Provider, MD  fluticasone (FLONASE) 50 MCG/ACT nasal spray Place 2 sprays into both nostrils daily.   Yes Historical Provider, MD  furosemide (LASIX) 20 MG tablet Take 40 mg by mouth 2 (two) times daily.   Yes Historical Provider, MD  Lactobacillus-Inulin ([place] PO) Take 1 tablet by mouth daily.   Yes Historical Provider, MD  losartan (COZAAR) 50 MG tablet Take 50 mg by mouth daily.   Yes Historical Provider, MD  magnesium hydroxide (MILK OF MAGNESIA) 400 MG/5ML suspension Take 30 mLs by mouth 2 (two) times daily as needed for mild constipation.   Yes Historical Provider, MD  omeprazole (PRILOSEC) 20 MG capsule Take 20 mg by mouth daily.   Yes Historical Provider, MD  oxycodone (OXY-IR) 5 MG capsule Take 5 mg by mouth every 6 (six) hours as needed for pain.   Yes Historical Provider, MD  oxyCODONE (OXYCONTIN) 10 mg 12 hr tablet Take 10 mg by mouth at bedtime.   Yes Historical Provider, MD  phenytoin (DILANTIN) 100 MG ER capsule Take 150 mg by mouth at bedtime.   Yes Historical  Provider, MD  phenytoin (DILANTIN) 50 MG tablet Chew 50 mg by mouth See admin instructions. Pt takes 50mg  in am, also takes additional 50mg  at bedtime with 100mg  = 150mg    Yes Historical Provider, MD  pramipexole (MIRAPEX) 0.5 MG tablet Take 0.5 mg by mouth at bedtime.   Yes Historical Provider, MD  senna-docusate (SENOKOT-S) 8.6-50 MG tablet Take 2 tablets by mouth at bedtime.   Yes Historical Provider, MD    Family History History reviewed. No pertinent family history.  Social History Social History  Substance Use Topics  . Smoking status: Never Smoker  . Smokeless tobacco: Never Used  . Alcohol use No     Allergies   Levaquin [levofloxacin in d5w]   Review of  Systems Review of Systems  Neurological: Positive for seizures.  All other systems reviewed and are negative.    Physical Exam Updated Vital Signs BP 172/82   Pulse 85   Resp 17   SpO2 96%   Physical Exam  Constitutional: She is oriented to person, place, and time. She appears well-developed and well-nourished.  HENT:  Head: Normocephalic.  Abrasion to right forehead  Eyes:  Right pupil 3mm and reactive, left pupil 3 mm and reactive  Cardiovascular: Normal rate and regular rhythm.   No murmur heard. Pulmonary/Chest: Effort normal and breath sounds normal. No respiratory distress.  Abdominal: Soft. There is no tenderness. There is no rebound and no guarding.  Musculoskeletal: She exhibits no edema or tenderness.  Neurological: She is alert and oriented to person, place, and time.  5/5 strength in BUE.  3/5 strength in BLE.  Confused and slow to answer questions.  Disoriented to time.    Skin: Skin is warm and dry.  Psychiatric: She has a normal mood and affect. Her behavior is normal.  Nursing note and vitals reviewed.    ED Treatments / Results  Labs (all labs ordered are listed, but only abnormal results are displayed) Labs Reviewed  COMPREHENSIVE METABOLIC PANEL - Abnormal; Notable for the following:       Result Value   Potassium 3.4 (*)    Chloride 99 (*)    Glucose, Bld 105 (*)    ALT 11 (*)    All other components within normal limits  URINALYSIS, ROUTINE W REFLEX MICROSCOPIC - Abnormal; Notable for the following:    Color, Urine STRAW (*)    Hgb urine dipstick SMALL (*)    Bacteria, UA RARE (*)    Squamous Epithelial / LPF 0-5 (*)    All other components within normal limits  PHENYTOIN LEVEL, TOTAL - Abnormal; Notable for the following:    Phenytoin Lvl 8.3 (*)    All other components within normal limits  PHENOBARBITAL LEVEL - Abnormal; Notable for the following:    Phenobarbital 10.5 (*)    All other components within normal limits  CBC WITH  DIFFERENTIAL/PLATELET  PATHOLOGIST SMEAR REVIEW  I-STAT TROPOININ, ED  CBG MONITORING, ED    EKG  EKG Interpretation  Date/Time:  Wednesday January 20 2017 09:28:16 EST Ventricular Rate:  86 PR Interval:    QRS Duration: 145 QT Interval:  408 QTC Calculation: 488 R Axis:   71 Text Interpretation:  Sinus rhythm Right bundle branch block Inferior infarct, age indeterminate Confirmed by Hazle Coca [id number]) on 01/20/2017 9:48:08 AM       Radiology Ct Head Wo Contrast  Result Date: 01/20/2017 CLINICAL DATA:  Seizure. EXAM: CT HEAD WITHOUT CONTRAST CT CERVICAL SPINE WITHOUT CONTRAST TECHNIQUE: Multidetector  CT imaging of the head and cervical spine was performed following the standard protocol without intravenous contrast. Multiplanar CT image reconstructions of the cervical spine were also generated. COMPARISON:  None. FINDINGS: CT HEAD FINDINGS Brain: Prominence of the sulci and ventricles identified compatible with brain atrophy. There is mild diffuse low-attenuation within the subcortical and periventricular white matter compatible with chronic microvascular disease. No evidence of acute infarction, hemorrhage, hydrocephalus, extra-axial collection or mass lesion/mass effect. Vascular: No hyperdense vessel or unexpected calcification. Skull: Normal. Negative for fracture or focal lesion. Sinuses/Orbits: No acute finding. Other: None. CT CERVICAL SPINE FINDINGS Alignment: Normal. Skull base and vertebrae: No acute fracture. No primary bone lesion or focal pathologic process. Soft tissues and spinal canal: No prevertebral fluid or swelling. No visible canal hematoma. Disc levels: Multi level disc space narrowing and ventral endplate spurring identified. This is most advanced at C3-4, C4-5 and C5-6. Upper chest: Negative. Other: None IMPRESSION: 1. No acute intracranial abnormalities. 2. Chronic microvascular disease and brain atrophy. 3. No evidence for cervical spine fracture 4. Multilevel  cervical degenerative disc disease. Electronically Signed   By: Kerby Moors M.D.   On: 01/20/2017 10:54   Ct Cervical Spine Wo Contrast  Result Date: 01/20/2017 CLINICAL DATA:  Seizure. EXAM: CT HEAD WITHOUT CONTRAST CT CERVICAL SPINE WITHOUT CONTRAST TECHNIQUE: Multidetector CT imaging of the head and cervical spine was performed following the standard protocol without intravenous contrast. Multiplanar CT image reconstructions of the cervical spine were also generated. COMPARISON:  None. FINDINGS: CT HEAD FINDINGS Brain: Prominence of the sulci and ventricles identified compatible with brain atrophy. There is mild diffuse low-attenuation within the subcortical and periventricular white matter compatible with chronic microvascular disease. No evidence of acute infarction, hemorrhage, hydrocephalus, extra-axial collection or mass lesion/mass effect. Vascular: No hyperdense vessel or unexpected calcification. Skull: Normal. Negative for fracture or focal lesion. Sinuses/Orbits: No acute finding. Other: None. CT CERVICAL SPINE FINDINGS Alignment: Normal. Skull base and vertebrae: No acute fracture. No primary bone lesion or focal pathologic process. Soft tissues and spinal canal: No prevertebral fluid or swelling. No visible canal hematoma. Disc levels: Multi level disc space narrowing and ventral endplate spurring identified. This is most advanced at C3-4, C4-5 and C5-6. Upper chest: Negative. Other: None IMPRESSION: 1. No acute intracranial abnormalities. 2. Chronic microvascular disease and brain atrophy. 3. No evidence for cervical spine fracture 4. Multilevel cervical degenerative disc disease. Electronically Signed   By: Kerby Moors M.D.   On: 01/20/2017 10:54   Dg Chest Port 1 View  Result Date: 01/20/2017 CLINICAL DATA:  Recent seizure activity EXAM: PORTABLE CHEST 1 VIEW COMPARISON:  None. FINDINGS: Cardiac shadow is within normal limits. Aortic calcifications are seen. The lungs are clear  bilaterally. Postsurgical changes in the right shoulder joint and degenerative changes of the left shoulder joint are seen. Scattered likely chronic compression deformities are noted in the thoracic spine. Old healing rib fractures are noted bilaterally. IMPRESSION: No acute abnormality noted.  Chronic bony changes as described. Electronically Signed   By: Inez Catalina M.D.   On: 01/20/2017 09:52    Procedures Procedures (including critical care time)  Medications Ordered in ED Medications  fosPHENYtoin (CEREBYX) 500 mg PE in sodium chloride 0.9 % 25 mL IVPB (not administered)     Initial Impression / Assessment and Plan / ED Course  I have reviewed the triage vital signs and the nursing notes.  Pertinent labs & imaging results that were available during my care of the patient were  reviewed by me and considered in my medical decision making (see chart for details).     Pt with hx/o seizure d/o here for evaluation following seizure.  Pt returned to baseline mental status during ED stay. Dilantin level is subtherapeutic - discussed with Neurologist on call - plan to provide dilantin load with increased dilantin dosing at home with close outpatient follow up.    Final Clinical Impressions(s) / ED Diagnoses   Final diagnoses:  Seizure ([place])  Seizure disorder [facility])    New Prescriptions New Prescriptions   No medications on file     Quintella Reichert, MD 01/20/17 1821

## 2017-01-20 NOTE — ED Notes (Signed)
Voided X 1.  Incontinence care given.

## 2017-01-25 DIAGNOSIS — F331 Major depressive disorder, recurrent, moderate: Secondary | ICD-10-CM | POA: Diagnosis not present

## 2017-01-25 DIAGNOSIS — G40909 Epilepsy, unspecified, not intractable, without status epilepticus: Secondary | ICD-10-CM | POA: Diagnosis not present

## 2017-01-25 DIAGNOSIS — E538 Deficiency of other specified B group vitamins: Secondary | ICD-10-CM | POA: Diagnosis not present

## 2017-01-25 DIAGNOSIS — I10 Essential (primary) hypertension: Secondary | ICD-10-CM | POA: Diagnosis not present

## 2017-01-28 DIAGNOSIS — L600 Ingrowing nail: Secondary | ICD-10-CM | POA: Diagnosis not present

## 2017-01-28 DIAGNOSIS — B351 Tinea unguium: Secondary | ICD-10-CM | POA: Diagnosis not present

## 2017-02-01 DIAGNOSIS — F0390 Unspecified dementia without behavioral disturbance: Secondary | ICD-10-CM | POA: Diagnosis not present

## 2017-02-01 DIAGNOSIS — M6281 Muscle weakness (generalized): Secondary | ICD-10-CM | POA: Diagnosis not present

## 2017-02-01 DIAGNOSIS — I10 Essential (primary) hypertension: Secondary | ICD-10-CM | POA: Diagnosis not present

## 2017-02-01 DIAGNOSIS — Z9181 History of falling: Secondary | ICD-10-CM | POA: Diagnosis not present

## 2017-02-01 DIAGNOSIS — M25861 Other specified joint disorders, right knee: Secondary | ICD-10-CM | POA: Diagnosis not present

## 2017-02-01 DIAGNOSIS — Z96653 Presence of artificial knee joint, bilateral: Secondary | ICD-10-CM | POA: Diagnosis not present

## 2017-02-01 DIAGNOSIS — M1990 Unspecified osteoarthritis, unspecified site: Secondary | ICD-10-CM | POA: Diagnosis not present

## 2017-02-01 DIAGNOSIS — I4891 Unspecified atrial fibrillation: Secondary | ICD-10-CM | POA: Diagnosis not present

## 2017-02-01 DIAGNOSIS — F331 Major depressive disorder, recurrent, moderate: Secondary | ICD-10-CM | POA: Diagnosis not present

## 2017-02-01 DIAGNOSIS — M810 Age-related osteoporosis without current pathological fracture: Secondary | ICD-10-CM | POA: Diagnosis not present

## 2017-02-01 DIAGNOSIS — G40909 Epilepsy, unspecified, not intractable, without status epilepticus: Secondary | ICD-10-CM | POA: Diagnosis not present

## 2017-02-03 DIAGNOSIS — Z79899 Other long term (current) drug therapy: Secondary | ICD-10-CM | POA: Diagnosis not present

## 2017-02-03 DIAGNOSIS — D518 Other vitamin B12 deficiency anemias: Secondary | ICD-10-CM | POA: Diagnosis not present

## 2017-02-03 DIAGNOSIS — E782 Mixed hyperlipidemia: Secondary | ICD-10-CM | POA: Diagnosis not present

## 2017-02-03 DIAGNOSIS — E119 Type 2 diabetes mellitus without complications: Secondary | ICD-10-CM | POA: Diagnosis not present

## 2017-02-03 DIAGNOSIS — E038 Other specified hypothyroidism: Secondary | ICD-10-CM | POA: Diagnosis not present

## 2017-02-08 DIAGNOSIS — Z96653 Presence of artificial knee joint, bilateral: Secondary | ICD-10-CM | POA: Diagnosis not present

## 2017-02-08 DIAGNOSIS — R296 Repeated falls: Secondary | ICD-10-CM | POA: Diagnosis not present

## 2017-02-08 DIAGNOSIS — M1990 Unspecified osteoarthritis, unspecified site: Secondary | ICD-10-CM | POA: Diagnosis not present

## 2017-02-08 DIAGNOSIS — M25861 Other specified joint disorders, right knee: Secondary | ICD-10-CM | POA: Diagnosis not present

## 2017-02-08 DIAGNOSIS — M6281 Muscle weakness (generalized): Secondary | ICD-10-CM | POA: Diagnosis not present

## 2017-02-08 DIAGNOSIS — G40909 Epilepsy, unspecified, not intractable, without status epilepticus: Secondary | ICD-10-CM | POA: Diagnosis not present

## 2017-02-08 DIAGNOSIS — I10 Essential (primary) hypertension: Secondary | ICD-10-CM | POA: Diagnosis not present

## 2017-02-10 DIAGNOSIS — Z96653 Presence of artificial knee joint, bilateral: Secondary | ICD-10-CM | POA: Diagnosis not present

## 2017-02-10 DIAGNOSIS — I10 Essential (primary) hypertension: Secondary | ICD-10-CM | POA: Diagnosis not present

## 2017-02-10 DIAGNOSIS — M1990 Unspecified osteoarthritis, unspecified site: Secondary | ICD-10-CM | POA: Diagnosis not present

## 2017-02-10 DIAGNOSIS — G40909 Epilepsy, unspecified, not intractable, without status epilepticus: Secondary | ICD-10-CM | POA: Diagnosis not present

## 2017-02-10 DIAGNOSIS — M6281 Muscle weakness (generalized): Secondary | ICD-10-CM | POA: Diagnosis not present

## 2017-02-10 DIAGNOSIS — M25861 Other specified joint disorders, right knee: Secondary | ICD-10-CM | POA: Diagnosis not present

## 2017-02-15 DIAGNOSIS — I10 Essential (primary) hypertension: Secondary | ICD-10-CM | POA: Diagnosis not present

## 2017-02-15 DIAGNOSIS — Z96653 Presence of artificial knee joint, bilateral: Secondary | ICD-10-CM | POA: Diagnosis not present

## 2017-02-15 DIAGNOSIS — M6281 Muscle weakness (generalized): Secondary | ICD-10-CM | POA: Diagnosis not present

## 2017-02-15 DIAGNOSIS — M25861 Other specified joint disorders, right knee: Secondary | ICD-10-CM | POA: Diagnosis not present

## 2017-02-15 DIAGNOSIS — M1990 Unspecified osteoarthritis, unspecified site: Secondary | ICD-10-CM | POA: Diagnosis not present

## 2017-02-15 DIAGNOSIS — G40909 Epilepsy, unspecified, not intractable, without status epilepticus: Secondary | ICD-10-CM | POA: Diagnosis not present

## 2017-02-17 DIAGNOSIS — Z96653 Presence of artificial knee joint, bilateral: Secondary | ICD-10-CM | POA: Diagnosis not present

## 2017-02-17 DIAGNOSIS — M6281 Muscle weakness (generalized): Secondary | ICD-10-CM | POA: Diagnosis not present

## 2017-02-17 DIAGNOSIS — M1990 Unspecified osteoarthritis, unspecified site: Secondary | ICD-10-CM | POA: Diagnosis not present

## 2017-02-17 DIAGNOSIS — G40909 Epilepsy, unspecified, not intractable, without status epilepticus: Secondary | ICD-10-CM | POA: Diagnosis not present

## 2017-02-17 DIAGNOSIS — I10 Essential (primary) hypertension: Secondary | ICD-10-CM | POA: Diagnosis not present

## 2017-02-17 DIAGNOSIS — M25861 Other specified joint disorders, right knee: Secondary | ICD-10-CM | POA: Diagnosis not present

## 2017-02-22 DIAGNOSIS — G40909 Epilepsy, unspecified, not intractable, without status epilepticus: Secondary | ICD-10-CM | POA: Diagnosis not present

## 2017-02-22 DIAGNOSIS — M1990 Unspecified osteoarthritis, unspecified site: Secondary | ICD-10-CM | POA: Diagnosis not present

## 2017-02-22 DIAGNOSIS — I10 Essential (primary) hypertension: Secondary | ICD-10-CM | POA: Diagnosis not present

## 2017-02-22 DIAGNOSIS — M25861 Other specified joint disorders, right knee: Secondary | ICD-10-CM | POA: Diagnosis not present

## 2017-02-22 DIAGNOSIS — Z96653 Presence of artificial knee joint, bilateral: Secondary | ICD-10-CM | POA: Diagnosis not present

## 2017-02-22 DIAGNOSIS — M6281 Muscle weakness (generalized): Secondary | ICD-10-CM | POA: Diagnosis not present

## 2017-02-23 DIAGNOSIS — I10 Essential (primary) hypertension: Secondary | ICD-10-CM | POA: Diagnosis not present

## 2017-02-23 DIAGNOSIS — R296 Repeated falls: Secondary | ICD-10-CM | POA: Diagnosis not present

## 2017-02-23 DIAGNOSIS — M25861 Other specified joint disorders, right knee: Secondary | ICD-10-CM | POA: Diagnosis not present

## 2017-02-23 DIAGNOSIS — Z96653 Presence of artificial knee joint, bilateral: Secondary | ICD-10-CM | POA: Diagnosis not present

## 2017-02-23 DIAGNOSIS — G40909 Epilepsy, unspecified, not intractable, without status epilepticus: Secondary | ICD-10-CM | POA: Diagnosis not present

## 2017-02-23 DIAGNOSIS — M6281 Muscle weakness (generalized): Secondary | ICD-10-CM | POA: Diagnosis not present

## 2017-02-23 DIAGNOSIS — M1990 Unspecified osteoarthritis, unspecified site: Secondary | ICD-10-CM | POA: Diagnosis not present

## 2017-02-23 DIAGNOSIS — D508 Other iron deficiency anemias: Secondary | ICD-10-CM | POA: Diagnosis not present

## 2017-02-24 DIAGNOSIS — M1990 Unspecified osteoarthritis, unspecified site: Secondary | ICD-10-CM | POA: Diagnosis not present

## 2017-02-24 DIAGNOSIS — G40909 Epilepsy, unspecified, not intractable, without status epilepticus: Secondary | ICD-10-CM | POA: Diagnosis not present

## 2017-02-24 DIAGNOSIS — M6281 Muscle weakness (generalized): Secondary | ICD-10-CM | POA: Diagnosis not present

## 2017-02-24 DIAGNOSIS — S0003XA Contusion of scalp, initial encounter: Secondary | ICD-10-CM | POA: Diagnosis not present

## 2017-02-24 DIAGNOSIS — Z96653 Presence of artificial knee joint, bilateral: Secondary | ICD-10-CM | POA: Diagnosis not present

## 2017-02-24 DIAGNOSIS — I10 Essential (primary) hypertension: Secondary | ICD-10-CM | POA: Diagnosis not present

## 2017-02-24 DIAGNOSIS — M25861 Other specified joint disorders, right knee: Secondary | ICD-10-CM | POA: Diagnosis not present

## 2017-02-24 DIAGNOSIS — S0990XA Unspecified injury of head, initial encounter: Secondary | ICD-10-CM | POA: Diagnosis not present

## 2017-02-24 DIAGNOSIS — S0001XA Abrasion of scalp, initial encounter: Secondary | ICD-10-CM | POA: Diagnosis not present

## 2017-02-24 DIAGNOSIS — Z79899 Other long term (current) drug therapy: Secondary | ICD-10-CM | POA: Diagnosis not present

## 2017-02-25 DIAGNOSIS — S0083XA Contusion of other part of head, initial encounter: Secondary | ICD-10-CM | POA: Diagnosis not present

## 2017-02-25 DIAGNOSIS — S098XXA Other specified injuries of head, initial encounter: Secondary | ICD-10-CM | POA: Diagnosis not present

## 2017-02-25 DIAGNOSIS — S0990XA Unspecified injury of head, initial encounter: Secondary | ICD-10-CM | POA: Diagnosis not present

## 2017-02-25 DIAGNOSIS — G4489 Other headache syndrome: Secondary | ICD-10-CM | POA: Diagnosis not present

## 2017-02-25 DIAGNOSIS — R51 Headache: Secondary | ICD-10-CM | POA: Diagnosis not present

## 2017-02-27 DIAGNOSIS — R4182 Altered mental status, unspecified: Secondary | ICD-10-CM | POA: Diagnosis not present

## 2017-02-27 DIAGNOSIS — R079 Chest pain, unspecified: Secondary | ICD-10-CM | POA: Diagnosis not present

## 2017-02-27 DIAGNOSIS — G8911 Acute pain due to trauma: Secondary | ICD-10-CM | POA: Diagnosis not present

## 2017-02-27 DIAGNOSIS — S92334A Nondisplaced fracture of third metatarsal bone, right foot, initial encounter for closed fracture: Secondary | ICD-10-CM | POA: Diagnosis not present

## 2017-02-27 DIAGNOSIS — S299XXA Unspecified injury of thorax, initial encounter: Secondary | ICD-10-CM | POA: Diagnosis not present

## 2017-02-27 DIAGNOSIS — M25571 Pain in right ankle and joints of right foot: Secondary | ICD-10-CM | POA: Diagnosis not present

## 2017-02-27 DIAGNOSIS — R51 Headache: Secondary | ICD-10-CM | POA: Diagnosis not present

## 2017-02-27 DIAGNOSIS — Z9181 History of falling: Secondary | ICD-10-CM | POA: Diagnosis not present

## 2017-02-27 DIAGNOSIS — M79609 Pain in unspecified limb: Secondary | ICD-10-CM | POA: Diagnosis not present

## 2017-02-27 DIAGNOSIS — S0990XA Unspecified injury of head, initial encounter: Secondary | ICD-10-CM | POA: Diagnosis not present

## 2017-02-27 DIAGNOSIS — S99921A Unspecified injury of right foot, initial encounter: Secondary | ICD-10-CM | POA: Diagnosis not present

## 2017-02-27 DIAGNOSIS — M25473 Effusion, unspecified ankle: Secondary | ICD-10-CM | POA: Diagnosis not present

## 2017-03-01 DIAGNOSIS — Z79899 Other long term (current) drug therapy: Secondary | ICD-10-CM | POA: Diagnosis not present

## 2017-03-02 DIAGNOSIS — F331 Major depressive disorder, recurrent, moderate: Secondary | ICD-10-CM | POA: Diagnosis not present

## 2017-03-02 DIAGNOSIS — R296 Repeated falls: Secondary | ICD-10-CM | POA: Diagnosis not present

## 2017-03-02 DIAGNOSIS — G40909 Epilepsy, unspecified, not intractable, without status epilepticus: Secondary | ICD-10-CM | POA: Diagnosis not present

## 2017-03-02 DIAGNOSIS — S92301D Fracture of unspecified metatarsal bone(s), right foot, subsequent encounter for fracture with routine healing: Secondary | ICD-10-CM | POA: Diagnosis not present

## 2017-03-04 DIAGNOSIS — Z79899 Other long term (current) drug therapy: Secondary | ICD-10-CM | POA: Diagnosis not present

## 2017-03-05 DIAGNOSIS — M25861 Other specified joint disorders, right knee: Secondary | ICD-10-CM | POA: Diagnosis not present

## 2017-03-05 DIAGNOSIS — Z96653 Presence of artificial knee joint, bilateral: Secondary | ICD-10-CM | POA: Diagnosis not present

## 2017-03-05 DIAGNOSIS — M1990 Unspecified osteoarthritis, unspecified site: Secondary | ICD-10-CM | POA: Diagnosis not present

## 2017-03-05 DIAGNOSIS — I10 Essential (primary) hypertension: Secondary | ICD-10-CM | POA: Diagnosis not present

## 2017-03-05 DIAGNOSIS — M6281 Muscle weakness (generalized): Secondary | ICD-10-CM | POA: Diagnosis not present

## 2017-03-05 DIAGNOSIS — G40909 Epilepsy, unspecified, not intractable, without status epilepticus: Secondary | ICD-10-CM | POA: Diagnosis not present

## 2017-03-09 DIAGNOSIS — S92309A Fracture of unspecified metatarsal bone(s), unspecified foot, initial encounter for closed fracture: Secondary | ICD-10-CM | POA: Diagnosis not present

## 2017-03-09 DIAGNOSIS — S93401A Sprain of unspecified ligament of right ankle, initial encounter: Secondary | ICD-10-CM | POA: Diagnosis not present

## 2017-03-09 DIAGNOSIS — S92331G Displaced fracture of third metatarsal bone, right foot, subsequent encounter for fracture with delayed healing: Secondary | ICD-10-CM | POA: Diagnosis not present

## 2017-03-10 DIAGNOSIS — I10 Essential (primary) hypertension: Secondary | ICD-10-CM | POA: Diagnosis not present

## 2017-03-10 DIAGNOSIS — M6281 Muscle weakness (generalized): Secondary | ICD-10-CM | POA: Diagnosis not present

## 2017-03-10 DIAGNOSIS — M810 Age-related osteoporosis without current pathological fracture: Secondary | ICD-10-CM | POA: Diagnosis not present

## 2017-03-10 DIAGNOSIS — M170 Bilateral primary osteoarthritis of knee: Secondary | ICD-10-CM | POA: Diagnosis not present

## 2017-03-10 DIAGNOSIS — R262 Difficulty in walking, not elsewhere classified: Secondary | ICD-10-CM | POA: Diagnosis not present

## 2017-03-10 DIAGNOSIS — G40909 Epilepsy, unspecified, not intractable, without status epilepticus: Secondary | ICD-10-CM | POA: Diagnosis not present

## 2017-03-11 DIAGNOSIS — R262 Difficulty in walking, not elsewhere classified: Secondary | ICD-10-CM | POA: Diagnosis not present

## 2017-03-11 DIAGNOSIS — K219 Gastro-esophageal reflux disease without esophagitis: Secondary | ICD-10-CM | POA: Diagnosis not present

## 2017-03-11 DIAGNOSIS — M6281 Muscle weakness (generalized): Secondary | ICD-10-CM | POA: Diagnosis not present

## 2017-03-11 DIAGNOSIS — M1990 Unspecified osteoarthritis, unspecified site: Secondary | ICD-10-CM | POA: Diagnosis not present

## 2017-03-11 DIAGNOSIS — D649 Anemia, unspecified: Secondary | ICD-10-CM | POA: Diagnosis not present

## 2017-03-11 DIAGNOSIS — I10 Essential (primary) hypertension: Secondary | ICD-10-CM | POA: Diagnosis not present

## 2017-03-12 DIAGNOSIS — R262 Difficulty in walking, not elsewhere classified: Secondary | ICD-10-CM | POA: Diagnosis not present

## 2017-03-12 DIAGNOSIS — M6281 Muscle weakness (generalized): Secondary | ICD-10-CM | POA: Diagnosis not present

## 2017-03-12 DIAGNOSIS — I10 Essential (primary) hypertension: Secondary | ICD-10-CM | POA: Diagnosis not present

## 2017-03-13 DIAGNOSIS — I10 Essential (primary) hypertension: Secondary | ICD-10-CM | POA: Diagnosis not present

## 2017-03-13 DIAGNOSIS — R262 Difficulty in walking, not elsewhere classified: Secondary | ICD-10-CM | POA: Diagnosis not present

## 2017-03-13 DIAGNOSIS — M6281 Muscle weakness (generalized): Secondary | ICD-10-CM | POA: Diagnosis not present

## 2017-03-15 DIAGNOSIS — I10 Essential (primary) hypertension: Secondary | ICD-10-CM | POA: Diagnosis not present

## 2017-03-15 DIAGNOSIS — M6281 Muscle weakness (generalized): Secondary | ICD-10-CM | POA: Diagnosis not present

## 2017-03-15 DIAGNOSIS — R262 Difficulty in walking, not elsewhere classified: Secondary | ICD-10-CM | POA: Diagnosis not present

## 2017-03-16 DIAGNOSIS — R262 Difficulty in walking, not elsewhere classified: Secondary | ICD-10-CM | POA: Diagnosis not present

## 2017-03-16 DIAGNOSIS — M6281 Muscle weakness (generalized): Secondary | ICD-10-CM | POA: Diagnosis not present

## 2017-03-16 DIAGNOSIS — I10 Essential (primary) hypertension: Secondary | ICD-10-CM | POA: Diagnosis not present

## 2017-03-17 DIAGNOSIS — M6281 Muscle weakness (generalized): Secondary | ICD-10-CM | POA: Diagnosis not present

## 2017-03-17 DIAGNOSIS — I10 Essential (primary) hypertension: Secondary | ICD-10-CM | POA: Diagnosis not present

## 2017-03-17 DIAGNOSIS — R262 Difficulty in walking, not elsewhere classified: Secondary | ICD-10-CM | POA: Diagnosis not present

## 2017-03-18 DIAGNOSIS — I10 Essential (primary) hypertension: Secondary | ICD-10-CM | POA: Diagnosis not present

## 2017-03-18 DIAGNOSIS — R262 Difficulty in walking, not elsewhere classified: Secondary | ICD-10-CM | POA: Diagnosis not present

## 2017-03-18 DIAGNOSIS — M6281 Muscle weakness (generalized): Secondary | ICD-10-CM | POA: Diagnosis not present

## 2017-03-19 DIAGNOSIS — R262 Difficulty in walking, not elsewhere classified: Secondary | ICD-10-CM | POA: Diagnosis not present

## 2017-03-19 DIAGNOSIS — M6281 Muscle weakness (generalized): Secondary | ICD-10-CM | POA: Diagnosis not present

## 2017-03-19 DIAGNOSIS — I10 Essential (primary) hypertension: Secondary | ICD-10-CM | POA: Diagnosis not present

## 2017-03-20 DIAGNOSIS — M6281 Muscle weakness (generalized): Secondary | ICD-10-CM | POA: Diagnosis not present

## 2017-03-20 DIAGNOSIS — R262 Difficulty in walking, not elsewhere classified: Secondary | ICD-10-CM | POA: Diagnosis not present

## 2017-03-20 DIAGNOSIS — I10 Essential (primary) hypertension: Secondary | ICD-10-CM | POA: Diagnosis not present

## 2017-03-22 DIAGNOSIS — R262 Difficulty in walking, not elsewhere classified: Secondary | ICD-10-CM | POA: Diagnosis not present

## 2017-03-22 DIAGNOSIS — I10 Essential (primary) hypertension: Secondary | ICD-10-CM | POA: Diagnosis not present

## 2017-03-22 DIAGNOSIS — M6281 Muscle weakness (generalized): Secondary | ICD-10-CM | POA: Diagnosis not present

## 2017-03-23 DIAGNOSIS — R262 Difficulty in walking, not elsewhere classified: Secondary | ICD-10-CM | POA: Diagnosis not present

## 2017-03-23 DIAGNOSIS — I10 Essential (primary) hypertension: Secondary | ICD-10-CM | POA: Diagnosis not present

## 2017-03-23 DIAGNOSIS — M6281 Muscle weakness (generalized): Secondary | ICD-10-CM | POA: Diagnosis not present

## 2017-03-24 DIAGNOSIS — I10 Essential (primary) hypertension: Secondary | ICD-10-CM | POA: Diagnosis not present

## 2017-03-24 DIAGNOSIS — R262 Difficulty in walking, not elsewhere classified: Secondary | ICD-10-CM | POA: Diagnosis not present

## 2017-03-24 DIAGNOSIS — M6281 Muscle weakness (generalized): Secondary | ICD-10-CM | POA: Diagnosis not present

## 2017-03-25 DIAGNOSIS — R262 Difficulty in walking, not elsewhere classified: Secondary | ICD-10-CM | POA: Diagnosis not present

## 2017-03-25 DIAGNOSIS — M6281 Muscle weakness (generalized): Secondary | ICD-10-CM | POA: Diagnosis not present

## 2017-03-25 DIAGNOSIS — I10 Essential (primary) hypertension: Secondary | ICD-10-CM | POA: Diagnosis not present

## 2017-03-26 DIAGNOSIS — G40909 Epilepsy, unspecified, not intractable, without status epilepticus: Secondary | ICD-10-CM | POA: Diagnosis not present

## 2017-03-26 DIAGNOSIS — R262 Difficulty in walking, not elsewhere classified: Secondary | ICD-10-CM | POA: Diagnosis not present

## 2017-03-26 DIAGNOSIS — I10 Essential (primary) hypertension: Secondary | ICD-10-CM | POA: Diagnosis not present

## 2017-03-26 DIAGNOSIS — M6281 Muscle weakness (generalized): Secondary | ICD-10-CM | POA: Diagnosis not present

## 2017-03-26 DIAGNOSIS — M170 Bilateral primary osteoarthritis of knee: Secondary | ICD-10-CM | POA: Diagnosis not present

## 2017-03-26 DIAGNOSIS — M810 Age-related osteoporosis without current pathological fracture: Secondary | ICD-10-CM | POA: Diagnosis not present

## 2017-03-27 DIAGNOSIS — R262 Difficulty in walking, not elsewhere classified: Secondary | ICD-10-CM | POA: Diagnosis not present

## 2017-03-27 DIAGNOSIS — I10 Essential (primary) hypertension: Secondary | ICD-10-CM | POA: Diagnosis not present

## 2017-03-27 DIAGNOSIS — M6281 Muscle weakness (generalized): Secondary | ICD-10-CM | POA: Diagnosis not present

## 2017-03-29 DIAGNOSIS — I10 Essential (primary) hypertension: Secondary | ICD-10-CM | POA: Diagnosis not present

## 2017-03-29 DIAGNOSIS — R262 Difficulty in walking, not elsewhere classified: Secondary | ICD-10-CM | POA: Diagnosis not present

## 2017-03-29 DIAGNOSIS — M6281 Muscle weakness (generalized): Secondary | ICD-10-CM | POA: Diagnosis not present

## 2017-03-30 DIAGNOSIS — M6281 Muscle weakness (generalized): Secondary | ICD-10-CM | POA: Diagnosis not present

## 2017-03-30 DIAGNOSIS — I10 Essential (primary) hypertension: Secondary | ICD-10-CM | POA: Diagnosis not present

## 2017-03-30 DIAGNOSIS — R262 Difficulty in walking, not elsewhere classified: Secondary | ICD-10-CM | POA: Diagnosis not present

## 2017-03-31 DIAGNOSIS — M6281 Muscle weakness (generalized): Secondary | ICD-10-CM | POA: Diagnosis not present

## 2017-03-31 DIAGNOSIS — I10 Essential (primary) hypertension: Secondary | ICD-10-CM | POA: Diagnosis not present

## 2017-03-31 DIAGNOSIS — R262 Difficulty in walking, not elsewhere classified: Secondary | ICD-10-CM | POA: Diagnosis not present

## 2017-04-01 DIAGNOSIS — M170 Bilateral primary osteoarthritis of knee: Secondary | ICD-10-CM | POA: Diagnosis not present

## 2017-04-01 DIAGNOSIS — G40909 Epilepsy, unspecified, not intractable, without status epilepticus: Secondary | ICD-10-CM | POA: Diagnosis not present

## 2017-04-01 DIAGNOSIS — M810 Age-related osteoporosis without current pathological fracture: Secondary | ICD-10-CM | POA: Diagnosis not present

## 2017-04-01 DIAGNOSIS — M6281 Muscle weakness (generalized): Secondary | ICD-10-CM | POA: Diagnosis not present

## 2017-04-01 DIAGNOSIS — I10 Essential (primary) hypertension: Secondary | ICD-10-CM | POA: Diagnosis not present

## 2017-04-01 DIAGNOSIS — R262 Difficulty in walking, not elsewhere classified: Secondary | ICD-10-CM | POA: Diagnosis not present

## 2017-04-02 DIAGNOSIS — I10 Essential (primary) hypertension: Secondary | ICD-10-CM | POA: Diagnosis not present

## 2017-04-02 DIAGNOSIS — M6281 Muscle weakness (generalized): Secondary | ICD-10-CM | POA: Diagnosis not present

## 2017-04-02 DIAGNOSIS — R262 Difficulty in walking, not elsewhere classified: Secondary | ICD-10-CM | POA: Diagnosis not present

## 2017-04-04 DIAGNOSIS — R262 Difficulty in walking, not elsewhere classified: Secondary | ICD-10-CM | POA: Diagnosis not present

## 2017-04-04 DIAGNOSIS — M6281 Muscle weakness (generalized): Secondary | ICD-10-CM | POA: Diagnosis not present

## 2017-04-04 DIAGNOSIS — I10 Essential (primary) hypertension: Secondary | ICD-10-CM | POA: Diagnosis not present

## 2017-04-05 DIAGNOSIS — I10 Essential (primary) hypertension: Secondary | ICD-10-CM | POA: Diagnosis not present

## 2017-04-05 DIAGNOSIS — M6281 Muscle weakness (generalized): Secondary | ICD-10-CM | POA: Diagnosis not present

## 2017-04-05 DIAGNOSIS — R262 Difficulty in walking, not elsewhere classified: Secondary | ICD-10-CM | POA: Diagnosis not present

## 2017-04-06 DIAGNOSIS — R262 Difficulty in walking, not elsewhere classified: Secondary | ICD-10-CM | POA: Diagnosis not present

## 2017-04-06 DIAGNOSIS — I10 Essential (primary) hypertension: Secondary | ICD-10-CM | POA: Diagnosis not present

## 2017-04-06 DIAGNOSIS — M6281 Muscle weakness (generalized): Secondary | ICD-10-CM | POA: Diagnosis not present

## 2017-04-07 DIAGNOSIS — M6281 Muscle weakness (generalized): Secondary | ICD-10-CM | POA: Diagnosis not present

## 2017-04-07 DIAGNOSIS — R262 Difficulty in walking, not elsewhere classified: Secondary | ICD-10-CM | POA: Diagnosis not present

## 2017-04-07 DIAGNOSIS — M810 Age-related osteoporosis without current pathological fracture: Secondary | ICD-10-CM | POA: Diagnosis not present

## 2017-04-07 DIAGNOSIS — G40909 Epilepsy, unspecified, not intractable, without status epilepticus: Secondary | ICD-10-CM | POA: Diagnosis not present

## 2017-04-07 DIAGNOSIS — M170 Bilateral primary osteoarthritis of knee: Secondary | ICD-10-CM | POA: Diagnosis not present

## 2017-04-07 DIAGNOSIS — I10 Essential (primary) hypertension: Secondary | ICD-10-CM | POA: Diagnosis not present

## 2017-04-08 DIAGNOSIS — I10 Essential (primary) hypertension: Secondary | ICD-10-CM | POA: Diagnosis not present

## 2017-04-08 DIAGNOSIS — R262 Difficulty in walking, not elsewhere classified: Secondary | ICD-10-CM | POA: Diagnosis not present

## 2017-04-08 DIAGNOSIS — M6281 Muscle weakness (generalized): Secondary | ICD-10-CM | POA: Diagnosis not present

## 2017-04-09 DIAGNOSIS — R262 Difficulty in walking, not elsewhere classified: Secondary | ICD-10-CM | POA: Diagnosis not present

## 2017-04-09 DIAGNOSIS — I10 Essential (primary) hypertension: Secondary | ICD-10-CM | POA: Diagnosis not present

## 2017-04-09 DIAGNOSIS — M6281 Muscle weakness (generalized): Secondary | ICD-10-CM | POA: Diagnosis not present

## 2017-04-17 DIAGNOSIS — M1990 Unspecified osteoarthritis, unspecified site: Secondary | ICD-10-CM | POA: Diagnosis not present

## 2017-04-17 DIAGNOSIS — M6281 Muscle weakness (generalized): Secondary | ICD-10-CM | POA: Diagnosis not present

## 2017-04-17 DIAGNOSIS — I10 Essential (primary) hypertension: Secondary | ICD-10-CM | POA: Diagnosis not present

## 2017-04-17 DIAGNOSIS — D649 Anemia, unspecified: Secondary | ICD-10-CM | POA: Diagnosis not present

## 2017-05-05 DIAGNOSIS — I10 Essential (primary) hypertension: Secondary | ICD-10-CM | POA: Diagnosis not present

## 2017-05-05 DIAGNOSIS — G40909 Epilepsy, unspecified, not intractable, without status epilepticus: Secondary | ICD-10-CM | POA: Diagnosis not present

## 2017-05-05 DIAGNOSIS — M170 Bilateral primary osteoarthritis of knee: Secondary | ICD-10-CM | POA: Diagnosis not present

## 2017-05-05 DIAGNOSIS — M810 Age-related osteoporosis without current pathological fracture: Secondary | ICD-10-CM | POA: Diagnosis not present

## 2017-05-10 DIAGNOSIS — M6281 Muscle weakness (generalized): Secondary | ICD-10-CM | POA: Diagnosis not present

## 2017-05-10 DIAGNOSIS — R262 Difficulty in walking, not elsewhere classified: Secondary | ICD-10-CM | POA: Diagnosis not present

## 2017-05-10 DIAGNOSIS — I10 Essential (primary) hypertension: Secondary | ICD-10-CM | POA: Diagnosis not present

## 2017-05-11 DIAGNOSIS — I10 Essential (primary) hypertension: Secondary | ICD-10-CM | POA: Diagnosis not present

## 2017-05-11 DIAGNOSIS — R262 Difficulty in walking, not elsewhere classified: Secondary | ICD-10-CM | POA: Diagnosis not present

## 2017-05-11 DIAGNOSIS — M6281 Muscle weakness (generalized): Secondary | ICD-10-CM | POA: Diagnosis not present

## 2017-05-12 DIAGNOSIS — I10 Essential (primary) hypertension: Secondary | ICD-10-CM | POA: Diagnosis not present

## 2017-05-12 DIAGNOSIS — M6281 Muscle weakness (generalized): Secondary | ICD-10-CM | POA: Diagnosis not present

## 2017-05-12 DIAGNOSIS — R262 Difficulty in walking, not elsewhere classified: Secondary | ICD-10-CM | POA: Diagnosis not present

## 2017-05-14 DIAGNOSIS — R262 Difficulty in walking, not elsewhere classified: Secondary | ICD-10-CM | POA: Diagnosis not present

## 2017-05-14 DIAGNOSIS — I10 Essential (primary) hypertension: Secondary | ICD-10-CM | POA: Diagnosis not present

## 2017-05-14 DIAGNOSIS — M6281 Muscle weakness (generalized): Secondary | ICD-10-CM | POA: Diagnosis not present

## 2017-05-15 DIAGNOSIS — M6281 Muscle weakness (generalized): Secondary | ICD-10-CM | POA: Diagnosis not present

## 2017-05-15 DIAGNOSIS — R262 Difficulty in walking, not elsewhere classified: Secondary | ICD-10-CM | POA: Diagnosis not present

## 2017-05-15 DIAGNOSIS — M1990 Unspecified osteoarthritis, unspecified site: Secondary | ICD-10-CM | POA: Diagnosis not present

## 2017-05-15 DIAGNOSIS — D649 Anemia, unspecified: Secondary | ICD-10-CM | POA: Diagnosis not present

## 2017-05-15 DIAGNOSIS — I10 Essential (primary) hypertension: Secondary | ICD-10-CM | POA: Diagnosis not present

## 2017-05-16 DIAGNOSIS — R262 Difficulty in walking, not elsewhere classified: Secondary | ICD-10-CM | POA: Diagnosis not present

## 2017-05-16 DIAGNOSIS — I10 Essential (primary) hypertension: Secondary | ICD-10-CM | POA: Diagnosis not present

## 2017-05-16 DIAGNOSIS — M6281 Muscle weakness (generalized): Secondary | ICD-10-CM | POA: Diagnosis not present

## 2017-05-17 DIAGNOSIS — I10 Essential (primary) hypertension: Secondary | ICD-10-CM | POA: Diagnosis not present

## 2017-05-17 DIAGNOSIS — R262 Difficulty in walking, not elsewhere classified: Secondary | ICD-10-CM | POA: Diagnosis not present

## 2017-05-17 DIAGNOSIS — M6281 Muscle weakness (generalized): Secondary | ICD-10-CM | POA: Diagnosis not present

## 2017-05-18 DIAGNOSIS — I10 Essential (primary) hypertension: Secondary | ICD-10-CM | POA: Diagnosis not present

## 2017-05-18 DIAGNOSIS — R262 Difficulty in walking, not elsewhere classified: Secondary | ICD-10-CM | POA: Diagnosis not present

## 2017-05-18 DIAGNOSIS — M6281 Muscle weakness (generalized): Secondary | ICD-10-CM | POA: Diagnosis not present

## 2017-05-20 DIAGNOSIS — I10 Essential (primary) hypertension: Secondary | ICD-10-CM | POA: Diagnosis not present

## 2017-05-20 DIAGNOSIS — R262 Difficulty in walking, not elsewhere classified: Secondary | ICD-10-CM | POA: Diagnosis not present

## 2017-05-20 DIAGNOSIS — M6281 Muscle weakness (generalized): Secondary | ICD-10-CM | POA: Diagnosis not present

## 2017-05-21 DIAGNOSIS — R262 Difficulty in walking, not elsewhere classified: Secondary | ICD-10-CM | POA: Diagnosis not present

## 2017-05-21 DIAGNOSIS — M6281 Muscle weakness (generalized): Secondary | ICD-10-CM | POA: Diagnosis not present

## 2017-05-21 DIAGNOSIS — I10 Essential (primary) hypertension: Secondary | ICD-10-CM | POA: Diagnosis not present

## 2017-05-23 DIAGNOSIS — R262 Difficulty in walking, not elsewhere classified: Secondary | ICD-10-CM | POA: Diagnosis not present

## 2017-05-23 DIAGNOSIS — M6281 Muscle weakness (generalized): Secondary | ICD-10-CM | POA: Diagnosis not present

## 2017-05-23 DIAGNOSIS — I10 Essential (primary) hypertension: Secondary | ICD-10-CM | POA: Diagnosis not present

## 2017-05-25 DIAGNOSIS — R262 Difficulty in walking, not elsewhere classified: Secondary | ICD-10-CM | POA: Diagnosis not present

## 2017-05-25 DIAGNOSIS — I10 Essential (primary) hypertension: Secondary | ICD-10-CM | POA: Diagnosis not present

## 2017-05-25 DIAGNOSIS — M6281 Muscle weakness (generalized): Secondary | ICD-10-CM | POA: Diagnosis not present

## 2017-05-27 DIAGNOSIS — R262 Difficulty in walking, not elsewhere classified: Secondary | ICD-10-CM | POA: Diagnosis not present

## 2017-05-27 DIAGNOSIS — M6281 Muscle weakness (generalized): Secondary | ICD-10-CM | POA: Diagnosis not present

## 2017-05-27 DIAGNOSIS — I10 Essential (primary) hypertension: Secondary | ICD-10-CM | POA: Diagnosis not present

## 2017-05-28 DIAGNOSIS — I10 Essential (primary) hypertension: Secondary | ICD-10-CM | POA: Diagnosis not present

## 2017-05-28 DIAGNOSIS — R262 Difficulty in walking, not elsewhere classified: Secondary | ICD-10-CM | POA: Diagnosis not present

## 2017-05-28 DIAGNOSIS — M6281 Muscle weakness (generalized): Secondary | ICD-10-CM | POA: Diagnosis not present

## 2017-05-29 DIAGNOSIS — I10 Essential (primary) hypertension: Secondary | ICD-10-CM | POA: Diagnosis not present

## 2017-05-29 DIAGNOSIS — M6281 Muscle weakness (generalized): Secondary | ICD-10-CM | POA: Diagnosis not present

## 2017-05-29 DIAGNOSIS — R262 Difficulty in walking, not elsewhere classified: Secondary | ICD-10-CM | POA: Diagnosis not present

## 2017-05-31 DIAGNOSIS — I10 Essential (primary) hypertension: Secondary | ICD-10-CM | POA: Diagnosis not present

## 2017-05-31 DIAGNOSIS — M6281 Muscle weakness (generalized): Secondary | ICD-10-CM | POA: Diagnosis not present

## 2017-05-31 DIAGNOSIS — R262 Difficulty in walking, not elsewhere classified: Secondary | ICD-10-CM | POA: Diagnosis not present

## 2017-06-08 DIAGNOSIS — M810 Age-related osteoporosis without current pathological fracture: Secondary | ICD-10-CM | POA: Diagnosis not present

## 2017-06-08 DIAGNOSIS — F331 Major depressive disorder, recurrent, moderate: Secondary | ICD-10-CM | POA: Diagnosis not present

## 2017-06-08 DIAGNOSIS — G40909 Epilepsy, unspecified, not intractable, without status epilepticus: Secondary | ICD-10-CM | POA: Diagnosis not present

## 2017-06-08 DIAGNOSIS — I10 Essential (primary) hypertension: Secondary | ICD-10-CM | POA: Diagnosis not present

## 2017-06-09 DIAGNOSIS — D649 Anemia, unspecified: Secondary | ICD-10-CM | POA: Diagnosis not present

## 2017-06-09 DIAGNOSIS — R569 Unspecified convulsions: Secondary | ICD-10-CM | POA: Diagnosis not present

## 2017-06-09 DIAGNOSIS — Z79899 Other long term (current) drug therapy: Secondary | ICD-10-CM | POA: Diagnosis not present

## 2017-06-09 DIAGNOSIS — I10 Essential (primary) hypertension: Secondary | ICD-10-CM | POA: Diagnosis not present

## 2017-06-19 DIAGNOSIS — E538 Deficiency of other specified B group vitamins: Secondary | ICD-10-CM | POA: Diagnosis not present

## 2017-06-19 DIAGNOSIS — I10 Essential (primary) hypertension: Secondary | ICD-10-CM | POA: Diagnosis not present

## 2017-06-19 DIAGNOSIS — M6281 Muscle weakness (generalized): Secondary | ICD-10-CM | POA: Diagnosis not present

## 2017-06-19 DIAGNOSIS — F331 Major depressive disorder, recurrent, moderate: Secondary | ICD-10-CM | POA: Diagnosis not present

## 2017-06-23 DIAGNOSIS — M6281 Muscle weakness (generalized): Secondary | ICD-10-CM | POA: Diagnosis not present

## 2017-06-23 DIAGNOSIS — I10 Essential (primary) hypertension: Secondary | ICD-10-CM | POA: Diagnosis not present

## 2017-06-23 DIAGNOSIS — R262 Difficulty in walking, not elsewhere classified: Secondary | ICD-10-CM | POA: Diagnosis not present

## 2017-06-24 DIAGNOSIS — I10 Essential (primary) hypertension: Secondary | ICD-10-CM | POA: Diagnosis not present

## 2017-06-24 DIAGNOSIS — M6281 Muscle weakness (generalized): Secondary | ICD-10-CM | POA: Diagnosis not present

## 2017-06-24 DIAGNOSIS — R262 Difficulty in walking, not elsewhere classified: Secondary | ICD-10-CM | POA: Diagnosis not present

## 2017-06-25 DIAGNOSIS — R262 Difficulty in walking, not elsewhere classified: Secondary | ICD-10-CM | POA: Diagnosis not present

## 2017-06-25 DIAGNOSIS — I10 Essential (primary) hypertension: Secondary | ICD-10-CM | POA: Diagnosis not present

## 2017-06-25 DIAGNOSIS — M6281 Muscle weakness (generalized): Secondary | ICD-10-CM | POA: Diagnosis not present

## 2017-06-28 DIAGNOSIS — R262 Difficulty in walking, not elsewhere classified: Secondary | ICD-10-CM | POA: Diagnosis not present

## 2017-06-28 DIAGNOSIS — M6281 Muscle weakness (generalized): Secondary | ICD-10-CM | POA: Diagnosis not present

## 2017-06-28 DIAGNOSIS — I10 Essential (primary) hypertension: Secondary | ICD-10-CM | POA: Diagnosis not present

## 2017-06-29 DIAGNOSIS — I10 Essential (primary) hypertension: Secondary | ICD-10-CM | POA: Diagnosis not present

## 2017-06-29 DIAGNOSIS — M6281 Muscle weakness (generalized): Secondary | ICD-10-CM | POA: Diagnosis not present

## 2017-06-29 DIAGNOSIS — R262 Difficulty in walking, not elsewhere classified: Secondary | ICD-10-CM | POA: Diagnosis not present

## 2017-06-30 DIAGNOSIS — M6281 Muscle weakness (generalized): Secondary | ICD-10-CM | POA: Diagnosis not present

## 2017-06-30 DIAGNOSIS — I10 Essential (primary) hypertension: Secondary | ICD-10-CM | POA: Diagnosis not present

## 2017-06-30 DIAGNOSIS — R262 Difficulty in walking, not elsewhere classified: Secondary | ICD-10-CM | POA: Diagnosis not present

## 2017-07-01 DIAGNOSIS — D649 Anemia, unspecified: Secondary | ICD-10-CM | POA: Diagnosis not present

## 2017-07-01 DIAGNOSIS — I10 Essential (primary) hypertension: Secondary | ICD-10-CM | POA: Diagnosis not present

## 2017-07-01 DIAGNOSIS — R262 Difficulty in walking, not elsewhere classified: Secondary | ICD-10-CM | POA: Diagnosis not present

## 2017-07-01 DIAGNOSIS — M6281 Muscle weakness (generalized): Secondary | ICD-10-CM | POA: Diagnosis not present

## 2017-07-02 DIAGNOSIS — I10 Essential (primary) hypertension: Secondary | ICD-10-CM | POA: Diagnosis not present

## 2017-07-02 DIAGNOSIS — M6281 Muscle weakness (generalized): Secondary | ICD-10-CM | POA: Diagnosis not present

## 2017-07-02 DIAGNOSIS — R262 Difficulty in walking, not elsewhere classified: Secondary | ICD-10-CM | POA: Diagnosis not present

## 2017-07-05 DIAGNOSIS — I10 Essential (primary) hypertension: Secondary | ICD-10-CM | POA: Diagnosis not present

## 2017-07-05 DIAGNOSIS — R262 Difficulty in walking, not elsewhere classified: Secondary | ICD-10-CM | POA: Diagnosis not present

## 2017-07-05 DIAGNOSIS — M6281 Muscle weakness (generalized): Secondary | ICD-10-CM | POA: Diagnosis not present

## 2017-07-06 DIAGNOSIS — M6281 Muscle weakness (generalized): Secondary | ICD-10-CM | POA: Diagnosis not present

## 2017-07-06 DIAGNOSIS — I10 Essential (primary) hypertension: Secondary | ICD-10-CM | POA: Diagnosis not present

## 2017-07-06 DIAGNOSIS — R262 Difficulty in walking, not elsewhere classified: Secondary | ICD-10-CM | POA: Diagnosis not present

## 2017-07-07 DIAGNOSIS — I10 Essential (primary) hypertension: Secondary | ICD-10-CM | POA: Diagnosis not present

## 2017-07-07 DIAGNOSIS — M6281 Muscle weakness (generalized): Secondary | ICD-10-CM | POA: Diagnosis not present

## 2017-07-07 DIAGNOSIS — R262 Difficulty in walking, not elsewhere classified: Secondary | ICD-10-CM | POA: Diagnosis not present

## 2017-07-12 DIAGNOSIS — G40909 Epilepsy, unspecified, not intractable, without status epilepticus: Secondary | ICD-10-CM | POA: Diagnosis not present

## 2017-07-12 DIAGNOSIS — N390 Urinary tract infection, site not specified: Secondary | ICD-10-CM | POA: Diagnosis not present

## 2017-07-12 DIAGNOSIS — I10 Essential (primary) hypertension: Secondary | ICD-10-CM | POA: Diagnosis not present

## 2017-07-12 DIAGNOSIS — M810 Age-related osteoporosis without current pathological fracture: Secondary | ICD-10-CM | POA: Diagnosis not present

## 2017-07-12 DIAGNOSIS — M170 Bilateral primary osteoarthritis of knee: Secondary | ICD-10-CM | POA: Diagnosis not present

## 2017-07-12 DIAGNOSIS — D649 Anemia, unspecified: Secondary | ICD-10-CM | POA: Diagnosis not present

## 2017-07-12 DIAGNOSIS — R319 Hematuria, unspecified: Secondary | ICD-10-CM | POA: Diagnosis not present

## 2017-07-15 DIAGNOSIS — D649 Anemia, unspecified: Secondary | ICD-10-CM | POA: Diagnosis not present

## 2017-07-15 DIAGNOSIS — I10 Essential (primary) hypertension: Secondary | ICD-10-CM | POA: Diagnosis not present

## 2017-07-17 DIAGNOSIS — F331 Major depressive disorder, recurrent, moderate: Secondary | ICD-10-CM | POA: Diagnosis not present

## 2017-07-17 DIAGNOSIS — K219 Gastro-esophageal reflux disease without esophagitis: Secondary | ICD-10-CM | POA: Diagnosis not present

## 2017-07-17 DIAGNOSIS — I10 Essential (primary) hypertension: Secondary | ICD-10-CM | POA: Diagnosis not present

## 2017-07-17 DIAGNOSIS — D649 Anemia, unspecified: Secondary | ICD-10-CM | POA: Diagnosis not present

## 2017-07-29 DIAGNOSIS — M6281 Muscle weakness (generalized): Secondary | ICD-10-CM | POA: Diagnosis not present

## 2017-07-29 DIAGNOSIS — R262 Difficulty in walking, not elsewhere classified: Secondary | ICD-10-CM | POA: Diagnosis not present

## 2017-07-29 DIAGNOSIS — I10 Essential (primary) hypertension: Secondary | ICD-10-CM | POA: Diagnosis not present

## 2017-07-30 DIAGNOSIS — R262 Difficulty in walking, not elsewhere classified: Secondary | ICD-10-CM | POA: Diagnosis not present

## 2017-07-30 DIAGNOSIS — I10 Essential (primary) hypertension: Secondary | ICD-10-CM | POA: Diagnosis not present

## 2017-07-30 DIAGNOSIS — M6281 Muscle weakness (generalized): Secondary | ICD-10-CM | POA: Diagnosis not present

## 2017-08-02 DIAGNOSIS — M6281 Muscle weakness (generalized): Secondary | ICD-10-CM | POA: Diagnosis not present

## 2017-08-02 DIAGNOSIS — R262 Difficulty in walking, not elsewhere classified: Secondary | ICD-10-CM | POA: Diagnosis not present

## 2017-08-02 DIAGNOSIS — I10 Essential (primary) hypertension: Secondary | ICD-10-CM | POA: Diagnosis not present

## 2017-08-03 DIAGNOSIS — R262 Difficulty in walking, not elsewhere classified: Secondary | ICD-10-CM | POA: Diagnosis not present

## 2017-08-03 DIAGNOSIS — I10 Essential (primary) hypertension: Secondary | ICD-10-CM | POA: Diagnosis not present

## 2017-08-03 DIAGNOSIS — M6281 Muscle weakness (generalized): Secondary | ICD-10-CM | POA: Diagnosis not present

## 2017-08-04 DIAGNOSIS — I10 Essential (primary) hypertension: Secondary | ICD-10-CM | POA: Diagnosis not present

## 2017-08-04 DIAGNOSIS — M6281 Muscle weakness (generalized): Secondary | ICD-10-CM | POA: Diagnosis not present

## 2017-08-04 DIAGNOSIS — R262 Difficulty in walking, not elsewhere classified: Secondary | ICD-10-CM | POA: Diagnosis not present

## 2017-08-05 DIAGNOSIS — I10 Essential (primary) hypertension: Secondary | ICD-10-CM | POA: Diagnosis not present

## 2017-08-05 DIAGNOSIS — M6281 Muscle weakness (generalized): Secondary | ICD-10-CM | POA: Diagnosis not present

## 2017-08-05 DIAGNOSIS — R262 Difficulty in walking, not elsewhere classified: Secondary | ICD-10-CM | POA: Diagnosis not present

## 2017-08-06 DIAGNOSIS — M6281 Muscle weakness (generalized): Secondary | ICD-10-CM | POA: Diagnosis not present

## 2017-08-06 DIAGNOSIS — R262 Difficulty in walking, not elsewhere classified: Secondary | ICD-10-CM | POA: Diagnosis not present

## 2017-08-06 DIAGNOSIS — I10 Essential (primary) hypertension: Secondary | ICD-10-CM | POA: Diagnosis not present

## 2017-08-07 DIAGNOSIS — Q742 Other congenital malformations of lower limb(s), including pelvic girdle: Secondary | ICD-10-CM | POA: Diagnosis not present

## 2017-08-09 DIAGNOSIS — I10 Essential (primary) hypertension: Secondary | ICD-10-CM | POA: Diagnosis not present

## 2017-08-09 DIAGNOSIS — R262 Difficulty in walking, not elsewhere classified: Secondary | ICD-10-CM | POA: Diagnosis not present

## 2017-08-09 DIAGNOSIS — M6281 Muscle weakness (generalized): Secondary | ICD-10-CM | POA: Diagnosis not present

## 2017-08-10 DIAGNOSIS — R262 Difficulty in walking, not elsewhere classified: Secondary | ICD-10-CM | POA: Diagnosis not present

## 2017-08-10 DIAGNOSIS — D508 Other iron deficiency anemias: Secondary | ICD-10-CM | POA: Diagnosis not present

## 2017-08-10 DIAGNOSIS — M6281 Muscle weakness (generalized): Secondary | ICD-10-CM | POA: Diagnosis not present

## 2017-08-10 DIAGNOSIS — G40909 Epilepsy, unspecified, not intractable, without status epilepticus: Secondary | ICD-10-CM | POA: Diagnosis not present

## 2017-08-10 DIAGNOSIS — I10 Essential (primary) hypertension: Secondary | ICD-10-CM | POA: Diagnosis not present

## 2017-08-10 DIAGNOSIS — M810 Age-related osteoporosis without current pathological fracture: Secondary | ICD-10-CM | POA: Diagnosis not present

## 2017-08-11 DIAGNOSIS — I10 Essential (primary) hypertension: Secondary | ICD-10-CM | POA: Diagnosis not present

## 2017-08-11 DIAGNOSIS — M6281 Muscle weakness (generalized): Secondary | ICD-10-CM | POA: Diagnosis not present

## 2017-08-11 DIAGNOSIS — R262 Difficulty in walking, not elsewhere classified: Secondary | ICD-10-CM | POA: Diagnosis not present

## 2017-08-12 DIAGNOSIS — M6281 Muscle weakness (generalized): Secondary | ICD-10-CM | POA: Diagnosis not present

## 2017-08-12 DIAGNOSIS — R262 Difficulty in walking, not elsewhere classified: Secondary | ICD-10-CM | POA: Diagnosis not present

## 2017-08-12 DIAGNOSIS — I10 Essential (primary) hypertension: Secondary | ICD-10-CM | POA: Diagnosis not present

## 2017-08-16 DIAGNOSIS — I10 Essential (primary) hypertension: Secondary | ICD-10-CM | POA: Diagnosis not present

## 2017-08-16 DIAGNOSIS — R260 Ataxic gait: Secondary | ICD-10-CM | POA: Diagnosis not present

## 2017-08-16 DIAGNOSIS — M150 Primary generalized (osteo)arthritis: Secondary | ICD-10-CM | POA: Diagnosis not present

## 2017-08-16 DIAGNOSIS — D649 Anemia, unspecified: Secondary | ICD-10-CM | POA: Diagnosis not present

## 2017-09-20 DIAGNOSIS — I10 Essential (primary) hypertension: Secondary | ICD-10-CM | POA: Diagnosis not present

## 2017-09-20 DIAGNOSIS — E559 Vitamin D deficiency, unspecified: Secondary | ICD-10-CM | POA: Diagnosis not present

## 2017-09-20 DIAGNOSIS — E039 Hypothyroidism, unspecified: Secondary | ICD-10-CM | POA: Diagnosis not present

## 2017-09-20 DIAGNOSIS — E785 Hyperlipidemia, unspecified: Secondary | ICD-10-CM | POA: Diagnosis not present

## 2017-09-20 DIAGNOSIS — Z79899 Other long term (current) drug therapy: Secondary | ICD-10-CM | POA: Diagnosis not present

## 2017-09-20 DIAGNOSIS — R569 Unspecified convulsions: Secondary | ICD-10-CM | POA: Diagnosis not present

## 2017-09-20 DIAGNOSIS — D649 Anemia, unspecified: Secondary | ICD-10-CM | POA: Diagnosis not present

## 2017-10-25 DIAGNOSIS — H353131 Nonexudative age-related macular degeneration, bilateral, early dry stage: Secondary | ICD-10-CM | POA: Diagnosis not present

## 2017-10-25 DIAGNOSIS — H26493 Other secondary cataract, bilateral: Secondary | ICD-10-CM | POA: Diagnosis not present

## 2017-10-25 DIAGNOSIS — H532 Diplopia: Secondary | ICD-10-CM | POA: Diagnosis not present

## 2017-12-13 IMAGING — CR DG CHEST 1V PORT
1 series · 1 of 1 positions shown · non-contrast
Comparison: None.

CLINICAL DATA: Recent seizure activity

EXAM:
PORTABLE CHEST 1 VIEW

[AP]
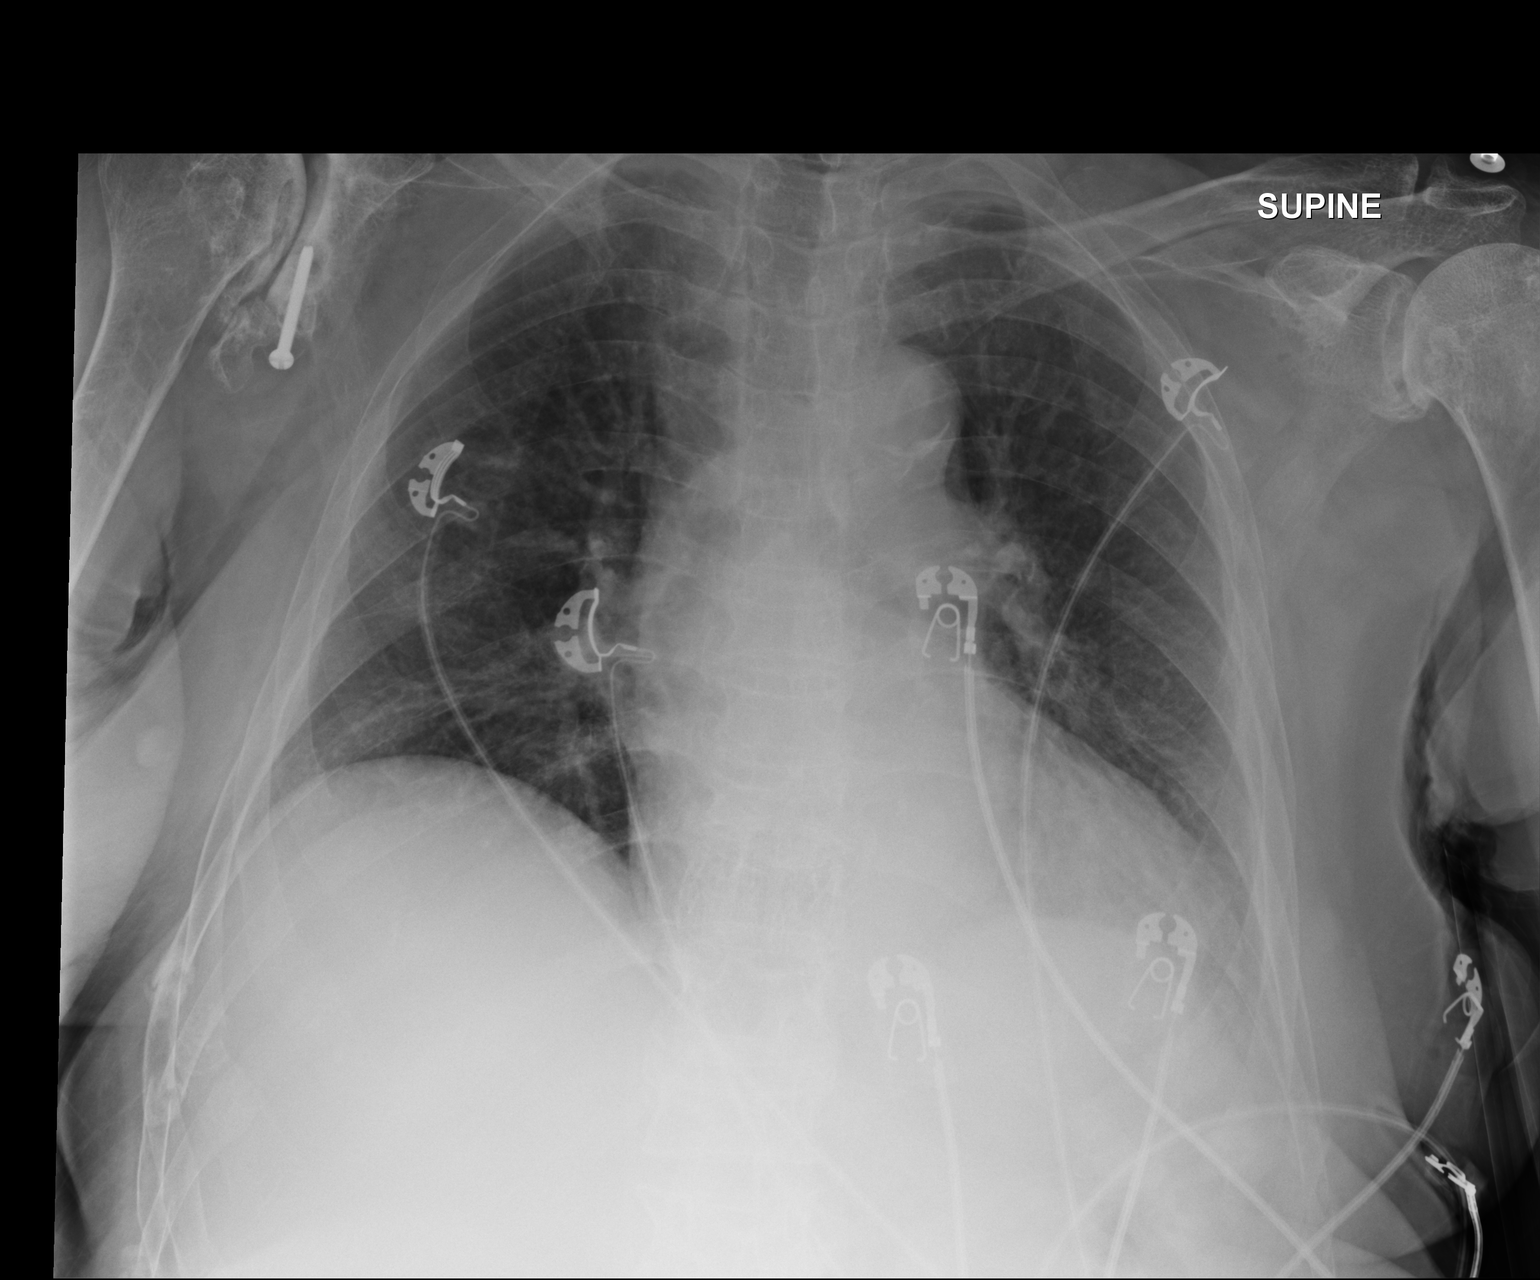

[1 of 1 positions shown; findings below may reference images not displayed]

FINDINGS: Cardiac shadow is within normal limits. Aortic calcifications are
seen. The lungs are clear bilaterally. Postsurgical changes in the
right shoulder joint and degenerative changes of the left shoulder
joint are seen. Scattered likely chronic compression deformities are
noted in the thoracic spine. Old healing rib fractures are noted
bilaterally.
IMPRESSION: No acute abnormality noted.  Chronic bony changes as described.

## 2017-12-13 IMAGING — CT CT CERVICAL SPINE W/O CM
3 of 4 series · 12 of 33 positions shown, 14 images · non-contrast
Comparison: None.

CLINICAL DATA: Seizure.

EXAM:
CT HEAD WITHOUT CONTRAST
CT CERVICAL SPINE WITHOUT CONTRAST
TECHNIQUE: Multidetector CT imaging of the head and cervical spine was
performed following the standard protocol without intravenous
contrast. Multiplanar CT image reconstructions of the cervical spine
were also generated.

[Series 4: head bone · axial · 0.46mm/px · z∈[-100,+0]mm · 4 of 76 slices shown, 5 images]
[im 17/76  soft-tissue]
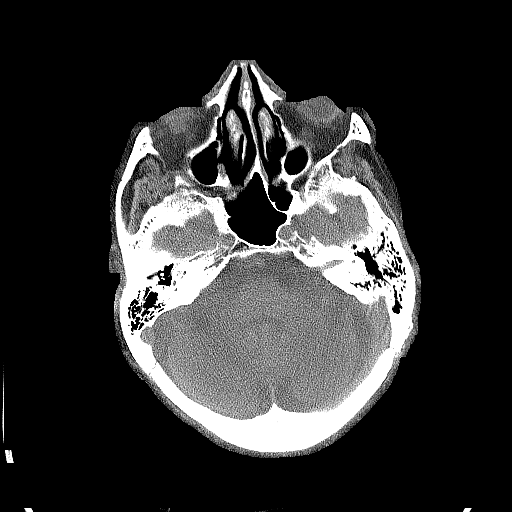
[im 17/76  bone]
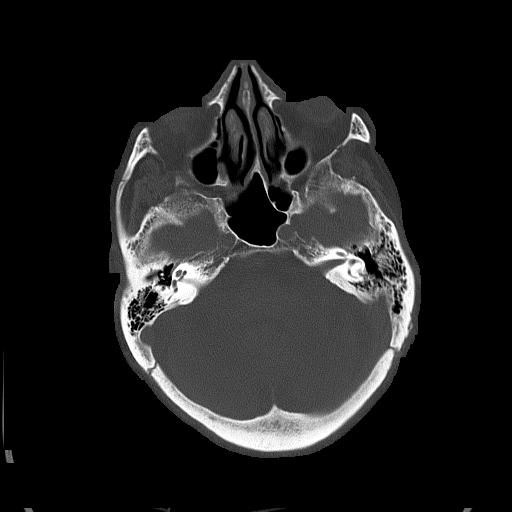
[im 34/76  bone]
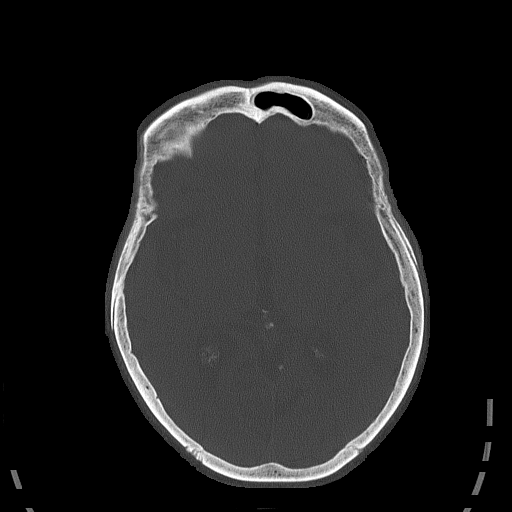
[im 51/76  bone]
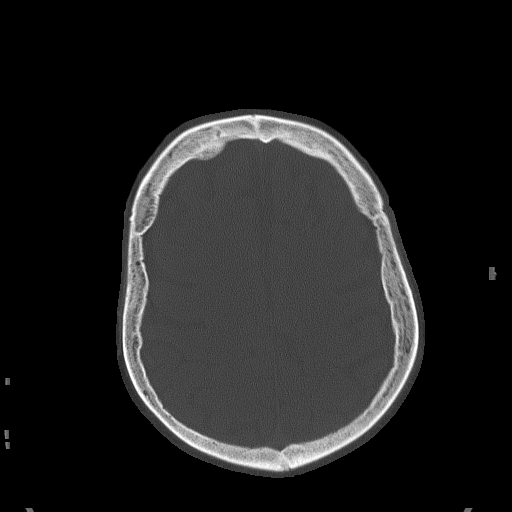
[im 67/76  bone]
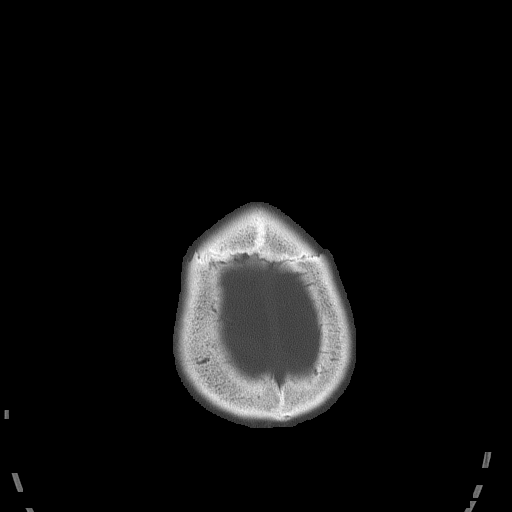

[Series 5: head without cor · coronal · non-contrast · 0.30mm/px · 3 of 67 slices shown]
[im 14/67  bone]
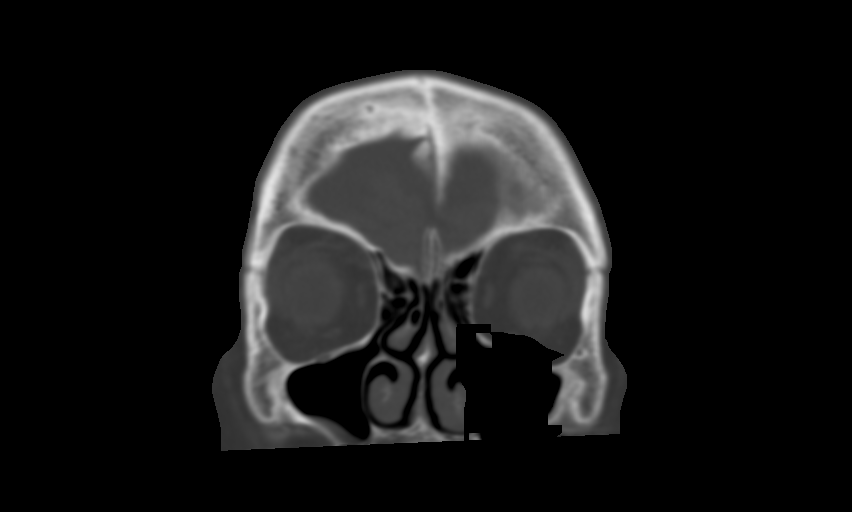
[im 27/67  bone]
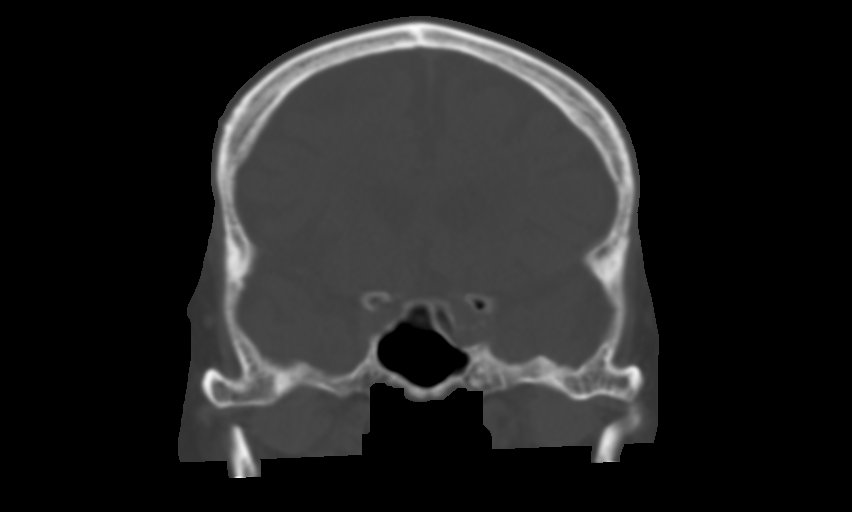
[im 40/67  bone]
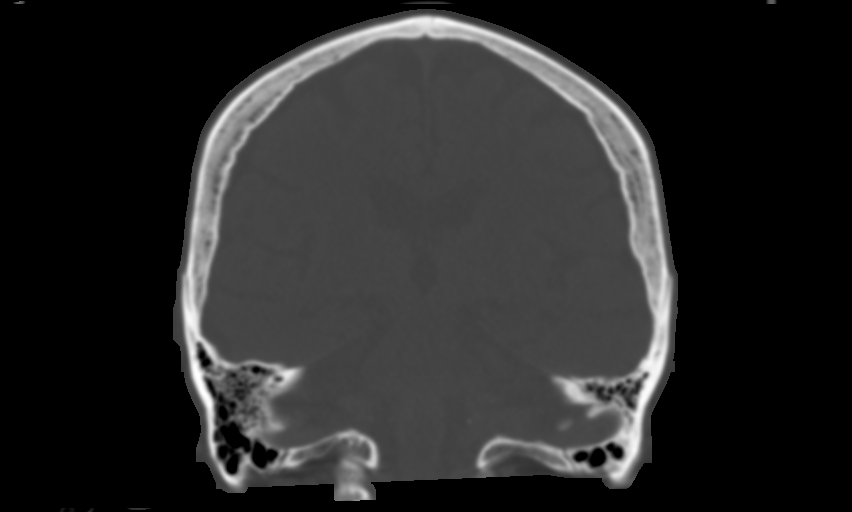

[Series 6: head without sag · sagittal · non-contrast · 0.34mm/px · 5 of 65 slices shown, 6 images]
[im 22/65  bone]
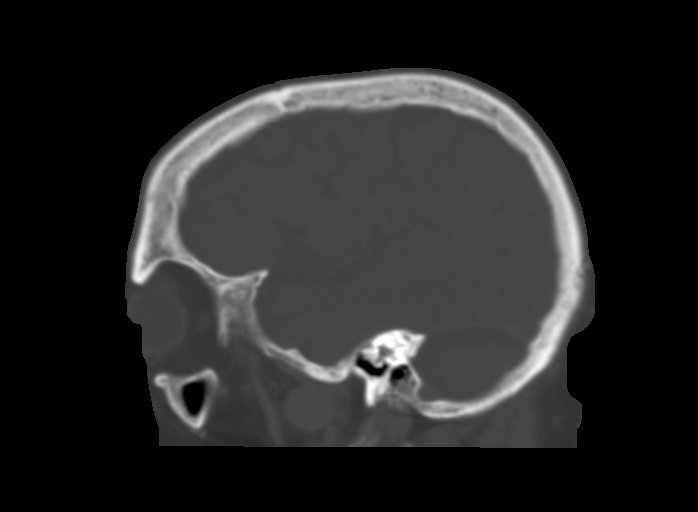
[im 27/65  bone]
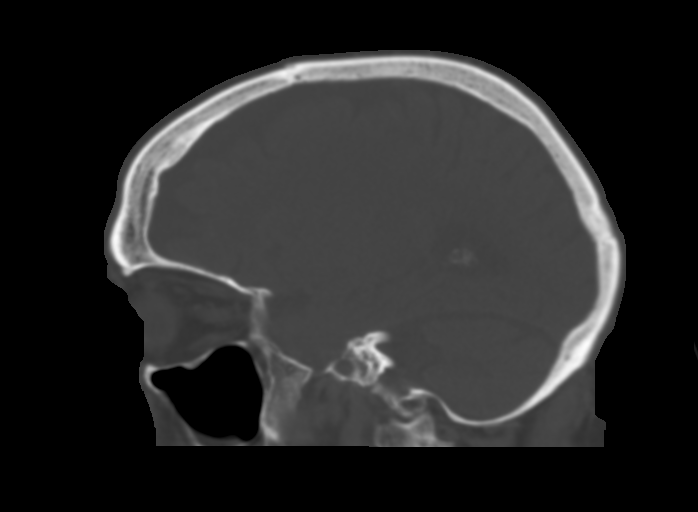
[im 33/65  soft-tissue]
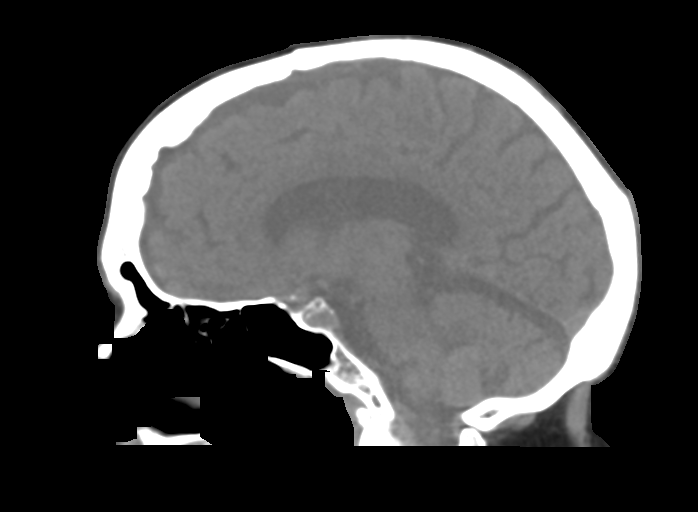
[im 33/65  bone]
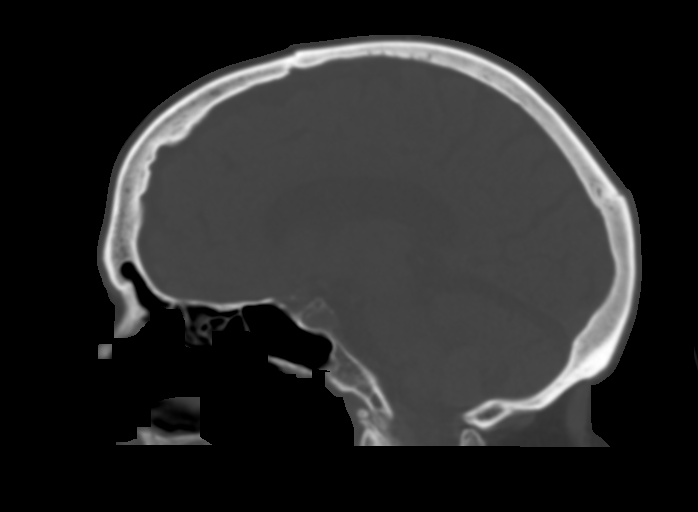
[im 38/65  bone]
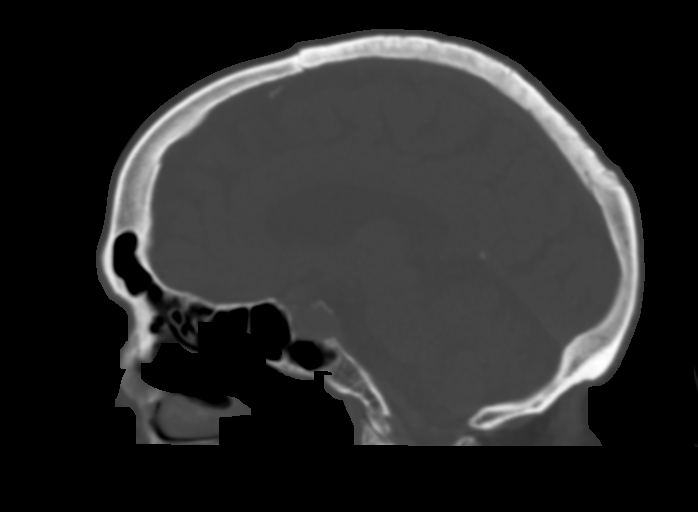
[im 43/65  bone]
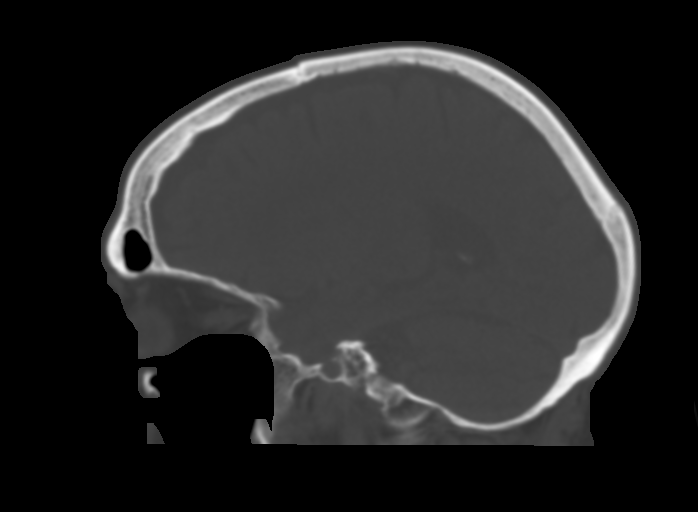

[12 of 33 positions shown; findings below may reference images not displayed]

FINDINGS: CT HEAD FINDINGS

Brain: Prominence of the sulci and ventricles identified compatible
with brain atrophy. There is mild diffuse low-attenuation within the
subcortical and periventricular white matter compatible with chronic
microvascular disease. No evidence of acute infarction, hemorrhage,
hydrocephalus, extra-axial collection or mass lesion/mass effect.

Vascular: No hyperdense vessel or unexpected calcification.

Skull: Normal. Negative for fracture or focal lesion.

Sinuses/Orbits: No acute finding.

Other: None.

CT CERVICAL SPINE FINDINGS

Alignment: Normal.

Skull base and vertebrae: No acute fracture. No primary bone lesion
or focal pathologic process.

Soft tissues and spinal canal: No prevertebral fluid or swelling. No
visible canal hematoma.

Disc levels: Multi level disc space narrowing and ventral endplate
spurring identified. This is most advanced at C3-4, C4-5 and C5-6.

Upper chest: Negative.

Other: None
IMPRESSION: 1. No acute intracranial abnormalities.
2. Chronic microvascular disease and brain atrophy.
3. No evidence for cervical spine fracture
4. Multilevel cervical degenerative disc disease.

## 2018-02-25 DIAGNOSIS — R531 Weakness: Secondary | ICD-10-CM | POA: Diagnosis not present

## 2018-02-25 DIAGNOSIS — M25561 Pain in right knee: Secondary | ICD-10-CM | POA: Diagnosis not present

## 2018-02-25 DIAGNOSIS — T148XXA Other injury of unspecified body region, initial encounter: Secondary | ICD-10-CM | POA: Diagnosis not present

## 2018-03-07 DIAGNOSIS — S76111S Strain of right quadriceps muscle, fascia and tendon, sequela: Secondary | ICD-10-CM | POA: Diagnosis not present

## 2018-03-07 DIAGNOSIS — T84022A Instability of internal right knee prosthesis, initial encounter: Secondary | ICD-10-CM | POA: Diagnosis not present

## 2020-05-13 DIAGNOSIS — R079 Chest pain, unspecified: Secondary | ICD-10-CM

## 2020-05-13 DIAGNOSIS — M25561 Pain in right knee: Secondary | ICD-10-CM

## 2020-05-13 DIAGNOSIS — G40909 Epilepsy, unspecified, not intractable, without status epilepticus: Secondary | ICD-10-CM | POA: Diagnosis not present

## 2020-05-13 DIAGNOSIS — I361 Nonrheumatic tricuspid (valve) insufficiency: Secondary | ICD-10-CM | POA: Diagnosis not present

## 2020-05-14 DIAGNOSIS — R079 Chest pain, unspecified: Secondary | ICD-10-CM | POA: Diagnosis not present

## 2020-05-14 DIAGNOSIS — G40909 Epilepsy, unspecified, not intractable, without status epilepticus: Secondary | ICD-10-CM | POA: Diagnosis not present

## 2020-05-14 DIAGNOSIS — M25561 Pain in right knee: Secondary | ICD-10-CM | POA: Diagnosis not present
